# Patient Record
Sex: Male | Born: 1965 | Race: White | Hispanic: No | Marital: Married | Smoking: Never smoker
Health system: Southern US, Community
[De-identification: ages and names within clinical notes are randomized; demographics above are authoritative.]

## PROBLEM LIST (undated history)

## (undated) DIAGNOSIS — E7800 Pure hypercholesterolemia, unspecified: Secondary | ICD-10-CM

## (undated) DIAGNOSIS — Z87442 Personal history of urinary calculi: Secondary | ICD-10-CM

## (undated) DIAGNOSIS — Z8269 Family history of other diseases of the musculoskeletal system and connective tissue: Secondary | ICD-10-CM

## (undated) DIAGNOSIS — I10 Essential (primary) hypertension: Secondary | ICD-10-CM

## (undated) DIAGNOSIS — G43009 Migraine without aura, not intractable, without status migrainosus: Secondary | ICD-10-CM

## (undated) DIAGNOSIS — I340 Nonrheumatic mitral (valve) insufficiency: Secondary | ICD-10-CM

## (undated) DIAGNOSIS — N289 Disorder of kidney and ureter, unspecified: Secondary | ICD-10-CM

## (undated) HISTORY — PX: LITHOTRIPSY: SUR834

## (undated) HISTORY — PX: SHOULDER ARTHROSCOPY: SHX128

---
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
## 2016-03-26 ENCOUNTER — Emergency Department
Admission: EM | Admit: 2016-03-26 | Discharge: 2016-03-26 | Disposition: A | Discharge status: Home / Self Care | Payer: Worker's Compensation | Attending: Emergency Medicine | Admitting: Emergency Medicine

## 2016-03-26 ENCOUNTER — Encounter: Payer: Self-pay | Admitting: Emergency Medicine

## 2016-03-26 ENCOUNTER — Emergency Department: Payer: Worker's Compensation

## 2016-03-26 DIAGNOSIS — Y9389 Activity, other specified: Secondary | ICD-10-CM | POA: Insufficient documentation

## 2016-03-26 DIAGNOSIS — I10 Essential (primary) hypertension: Secondary | ICD-10-CM | POA: Diagnosis not present

## 2016-03-26 DIAGNOSIS — M7652 Patellar tendinitis, left knee: Secondary | ICD-10-CM | POA: Diagnosis not present

## 2016-03-26 DIAGNOSIS — Y929 Unspecified place or not applicable: Secondary | ICD-10-CM | POA: Insufficient documentation

## 2016-03-26 DIAGNOSIS — Y990 Civilian activity done for income or pay: Secondary | ICD-10-CM | POA: Diagnosis not present

## 2016-03-26 DIAGNOSIS — X501XXA Overexertion from prolonged static or awkward postures, initial encounter: Secondary | ICD-10-CM | POA: Insufficient documentation

## 2016-03-26 DIAGNOSIS — M25562 Pain in left knee: Secondary | ICD-10-CM | POA: Diagnosis present

## 2016-03-26 HISTORY — DX: Essential (primary) hypertension: I10

## 2016-03-26 HISTORY — DX: Nonrheumatic mitral (valve) insufficiency: I34.0

## 2016-03-26 MED ORDER — NAPROXEN 500 MG PO TABS: 500.0000 mg | ORAL_TABLET | Freq: Two times a day (BID) | ORAL | Status: DC

## 2016-03-26 MED ORDER — TRAMADOL HCL 50 MG PO TABS: 50.0000 mg | ORAL_TABLET | Freq: Four times a day (QID) | ORAL | 0 refills | Status: DC | PRN

## 2016-03-26 NOTE — ED Provider Notes (Signed)
[facility] Emergency Department Provider Note   ____________________________________________   None    (approximate)  I have reviewed the triage vital signs and the nursing notes.   HISTORY  Chief Complaint Knee Pain    HPI Jose Downs is a 50 y.o. male patient complaining of left knee pain status post prolonged kneeling incident 3 days ago. Patient state he felt a "pop" upon standing. Patient states since incident in the history of unstable in the pain radiates from dull to sharp. Patient state pain does increase with ambulation. Patient currently rates the pain as a 2/10. Patient states took meloxicam for this complaint.   Past Medical History:  Diagnosis Date  . Hypertension   . Mitral valve regurgitation     There are no active problems to display for this patient.   Past Surgical History:  Procedure Laterality Date  . HERNIA REPAIR    . SHOULDER ARTHROSCOPY Right     Prior to Admission medications   Medication Sig Start Date End Date Taking? Authorizing Provider  naproxen (NAPROSYN) 500 MG tablet Take 1 tablet (500 mg total) by mouth 2 (two) times daily with a meal. 03/26/16   Sable Feil, PA-C  traMADol (ULTRAM) 50 MG tablet Take 1 tablet (50 mg total) by mouth every 6 (six) hours as needed. 03/26/16 03/26/17  Sable Feil, PA-C    Allergies Review of patient's allergies indicates no known allergies.  No family history on file.  Social History Social History  Substance Use Topics  . Smoking status: Never Smoker  . Smokeless tobacco: Never Used  . Alcohol use Yes    Review of Systems Constitutional: No fever/chills Eyes: No visual changes. ENT: No sore throat. Cardiovascular: Denies chest pain. Respiratory: Denies shortness of breath. Gastrointestinal: No abdominal pain.  No nausea, no vomiting.  No diarrhea.  No constipation. Genitourinary: Negative for dysuria. Musculoskeletal: Positive for left knee pain Skin:  Negative for rash. Neurological: Negative for headaches, focal weakness or numbness. Endocrine:Hypertension ____________________________________________   PHYSICAL EXAM:  VITAL SIGNS: ED Triage Vitals  Enc Vitals Group     BP 03/26/16 1130 (!) 165/110     Pulse Rate 03/26/16 1130 74     Resp 03/26/16 1130 20     Temp 03/26/16 1130 98.4 F (36.9 C)     Temp Source 03/26/16 1130 Oral     SpO2 03/26/16 1130 98 %     Weight 03/26/16 1128 162 lb (73.5 kg)     Height 03/26/16 1128 5\' 6"  (1.676 m)     Head Circumference --      Peak Flow --      Pain Score 03/26/16 1128 3     Pain Loc --      Pain Edu? --      Excl. in [place]? --     Constitutional: Alert and oriented. Well appearing and in no acute distress. Eyes: Conjunctivae are normal. PERRL. EOMI. Head: Atraumatic. Nose: No congestion/rhinnorhea. Mouth/Throat: Mucous membranes are moist.  Oropharynx non-erythematous. Neck: No stridor.  No cervical spine tenderness to palpation. Hematological/Lymphatic/Immunilogical: No cervical lymphadenopathy. Cardiovascular: Normal rate, regular rhythm. Grossly normal heart sounds.  Good peripheral circulation. Respiratory: Normal respiratory effort.  No retractions. Lungs CTAB. Gastrointestinal: Soft and nontender. No distention. No abdominal bruits. No CVA tenderness. Musculoskeletal: No lower extremity tenderness nor edema.  No joint effusions. Neurologic:  Normal speech and language. No gross focal neurologic deficits are appreciated. No gait instability. Skin:  Skin is warm,  dry and intact. No rash noted. Psychiatric: Mood and affect are normal. Speech and behavior are normal.  ____________________________________________   LABS (all labs ordered are listed, but only abnormal results are displayed)  Labs Reviewed - No data to display ____________________________________________  EKG   ____________________________________________  RADIOLOGY No acute findings on x-ray of the  left knee.  ____________________________________________   PROCEDURES  Procedure(s) performed: None  Procedures  Critical Care performed: No  ____________________________________________   INITIAL IMPRESSION / ASSESSMENT AND PLAN / ED COURSE  Pertinent labs & imaging results that were available during my care of the patient were reviewed by me and considered in my medical decision making (see chart for details).  Tendinitis left knee. Discussed x-ray finding with patient. Patient given discharge Instructions. Patient advised to wear an elastic knee support 3-5 days. Patient may return back to work with limitations.  Clinical Course     ____________________________________________   FINAL CLINICAL IMPRESSION(S) / ED DIAGNOSES  Final diagnoses:  Patellar tendinitis of left knee      NEW MEDICATIONS STARTED DURING THIS VISIT:  New Prescriptions   NAPROXEN (NAPROSYN) 500 MG TABLET    Take 1 tablet (500 mg total) by mouth 2 (two) times daily with a meal.   TRAMADOL (ULTRAM) 50 MG TABLET    Take 1 tablet (50 mg total) by mouth every 6 (six) hours as needed.     Note:  This document was prepared using Dragon voice recognition software and may include unintentional dictation errors.    Sable Feil, PA-C 03/26/16 1302    Harvest Dark, MD 03/26/16 1524

## 2016-03-26 NOTE — ED Notes (Addendum)
Spoke with Mickel Baas from [place] at [phone] pt employer and she said their insurance provider is [employer] for [employer] and they require urine drug screen

## 2016-03-26 NOTE — ED Notes (Signed)
Workers Comp  Pt reports that he injured left knee Sunday around 11am - pt reports he as kneeling down for several hours and when he stood up he heard something "pop inside the joint and the joint is unstable" - left knee hurts off and on with no swelling noted today

## 2016-03-26 NOTE — ED Notes (Signed)
Pt completed workman's comp per employer, [place] and urine specimen completed and hand delivered to lab, pt given his and employers copy

## 2016-03-26 NOTE — ED Triage Notes (Signed)
States he is having pain to left knee  States he did a lot of kneeling and felt a pop to same knee on sunday

## 2016-08-02 HISTORY — PX: KNEE ARTHROSCOPY: SUR90

## 2016-09-06 ENCOUNTER — Encounter: Payer: Self-pay | Admitting: Urology

## 2016-09-06 ENCOUNTER — Ambulatory Visit (INDEPENDENT_AMBULATORY_CARE_PROVIDER_SITE_OTHER): Payer: BLUE CROSS/BLUE SHIELD | Admitting: Urology

## 2016-09-06 VITALS — BP 142/99 | HR 66 | Ht 66.0 in | Wt 175.7 lb

## 2016-09-06 DIAGNOSIS — R1032 Left lower quadrant pain: Secondary | ICD-10-CM

## 2016-09-06 DIAGNOSIS — Z125 Encounter for screening for malignant neoplasm of prostate: Secondary | ICD-10-CM | POA: Diagnosis not present

## 2016-09-06 NOTE — Progress Notes (Signed)
09/06/2016 10:19 AM   Dierdre Highman [birth year] [id number]  Referring provider: Luna Glasgow, DO [facility] [address] [place], [place] [address]  Chief Complaint  Patient presents with  . New Patient (Initial Visit)    groin pain x 1 mth, pt states it started after knee surgery    HPI: 51 year old male who presented initially to walk in clinic complaining of left groin pain.  He reports that he had knee surgery approximately one month ago in a few weeks after the surgery, developed left groin pain and swelling. As such, he sought further evaluation of walk-in clinic.  On examination, he is noted to have what was described as a "Small cord running posterior from medial left scrotum, similar on the right but not as pronounced" by Dr. Deloria Lair at walk in clinic.    Mr. Tawanna Sat notes that when he initially had the pain, it was dull and aching. The pain is reproducible with palpation.  He reports that he felt a fullness in his left scrotal/perineal area which initially was firm and large but is since reduced to about half the size. For the past week, the pain has started to resolve but is no very faint.  He denies any urinary issues. No gross hematuria, dysuria, frequency or urgency. No fevers or chills. No urethral discharge. No known sexual transmitted infection exposures.  He does have some baseline urinary intermittency and gets up once at night to void. Otherwise, no urinary complaints.  He also is requesting prostate cancer screening today. He does not currently have a PCP. No family history of prostate cancer.    PMH: Past Medical History:  Diagnosis Date  . Hypertension   . Mitral valve regurgitation     Surgical History: Past Surgical History:  Procedure Laterality Date  . HERNIA REPAIR    . HERNIA REPAIR  1982  . KNEE ARTHROSCOPY Left 08/02/2016  . LITHOTRIPSY    . SHOULDER ARTHROSCOPY Right     Home Medications:  Allergies as of  09/06/2016      Reactions   Other Palpitations, Swelling   Nuts/tree fruits/artificial sweeteners      Medication List       Accurate as of 09/06/16 11:59 PM. Always use your most recent med list.          aspirin EC 81 MG tablet Take 81 mg by mouth daily.   meloxicam 15 MG tablet Commonly known as:  MOBIC Take 15 mg by mouth daily.   valsartan 80 MG tablet Commonly known as:  DIOVAN       Allergies:  Allergies  Allergen Reactions  . Other Palpitations and Swelling    Nuts/tree fruits/artificial sweeteners    Family History: No family history on file.  Social History:  reports that he has never smoked. He has never used smokeless tobacco. He reports that he drinks alcohol. His drug history is not on file.  ROS: UROLOGY Frequent Urination?: No Hard to postpone urination?: No Burning/pain with urination?: No Get up at night to urinate?: Yes Leakage of urine?: No Urine stream starts and stops?: Yes Trouble starting stream?: No Do you have to strain to urinate?: No Blood in urine?: No Urinary tract infection?: No Sexually transmitted disease?: No Injury to kidneys or bladder?: No Painful intercourse?: No Weak stream?: No Erection problems?: No Penile pain?: No  Gastrointestinal Nausea?: No Vomiting?: No Indigestion/heartburn?: Yes Diarrhea?: Yes Constipation?: No  Constitutional Fever: No Night sweats?: No Weight loss?: No  Fatigue?: No  Skin Skin rash/lesions?: No Itching?: No  Eyes Blurred vision?: No Double vision?: No  Ears/Nose/Throat Sore throat?: No Sinus problems?: No  Hematologic/Lymphatic Swollen glands?: No Easy bruising?: No  Cardiovascular Leg swelling?: No Chest pain?: No  Respiratory Cough?: Yes Shortness of breath?: No  Endocrine Excessive thirst?: No  Musculoskeletal Back pain?: Yes Joint pain?: No  Neurological Headaches?: Yes Dizziness?: Yes  Psychologic Depression?: No Anxiety?: No  Physical  Exam: BP (!) 142/99   Pulse 66   Ht 5\' 6"  (1.676 m)   Wt 175 lb 11.2 oz (79.7 kg)   BMI 28.36 kg/m   Constitutional:  Alert and oriented, No acute distress. HEENT: [place] AT, moist mucus membranes.  Trachea midline, no masses. Cardiovascular: No clubbing, cyanosis, or edema. Respiratory: Normal respiratory effort, no increased work of breathing. GI: Abdomen is soft, nontender, nondistended, no abdominal masses.  No palpable inguinal hernias. GU: Circumcised phallus with orthotopic patent meatus. Normal scrotum with bilateral descended testicles, no masses, nontender.  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 Patient demonstrates a maneuver where he deviates his scrotum towards the right side and palpates in his perineum/urethral area. On my examination, this is consistent with normal anatomic structures in this area.   Rectal: Normal sphincter tone. 30 cc prostate, nontender, no nodules. Skin: No rashes, bruises or suspicious lesions. Lymph: Subtle left inguinal lymphadenopathy, less than 1 cm, non-fixed.Marland Kitchen Neurologic: Grossly intact, no focal deficits, moving all 4 extremities. Psychiatric: Normal mood and affect.  Laboratory Data: No recent laboratory data available.  Urinalysis N/a  Pertinent Imaging: n/a  Assessment & Plan:    1. Left groin pain Small left inguinal node which is nontender, non-concerning. No other scrotal/inguinal pathology on examination today  Patient's report, pain is improving spontaneously I have recommended follow-up as needed if his pain recurs or he notes enlarging node/mass  2. Screening PSA (prostate specific antigen) PSA screening guidelines were reviewed today in detail including risk-benefits Shared decision-making and has elected to proceed with PSA screening per his request Rectal exam unremarkable Will call with PSA results - PSA   Return if symptoms worsen or fail to improve.    Return if symptoms worsen or  fail to improve.  [name], MD  [facility] [address] [place], [place] [address] [phone]

## 2016-09-07 LAB — PSA: Prostate Specific Ag, Serum: 4.4 ng/mL — ABNORMAL HIGH (ref 0.0–4.0)

## 2016-09-09 ENCOUNTER — Encounter: Payer: Self-pay | Admitting: Urology

## 2016-09-09 ENCOUNTER — Telehealth: Payer: Self-pay

## 2016-09-09 DIAGNOSIS — Z125 Encounter for screening for malignant neoplasm of prostate: Secondary | ICD-10-CM

## 2016-09-09 NOTE — Telephone Encounter (Signed)
-----   Message from [name], MD sent at 09/09/2016  9:54 AM EDT ----- Please let this patient know his PSA is elevated at 4.4. This is higher than expected in a 51 year old male. Unfortunately, we have no previous PSAs for comparison. Given his recent history of scrotal, inguinal pain, I think it is wise to repeat this in about 3 months to reassess. Please schedule him for a lab draw followed by a visit shortly thereafter in 3 months. We may need to think about a biopsy in the future.  [name], MD

## 2016-09-09 NOTE — Telephone Encounter (Signed)
Spoke with pt in reference to PSA results. Made pt aware will need labs again in 66mo and depending on results may need to discuss a bx. Pt voiced understanding. Lab appt made and orders placed.

## 2016-12-10 ENCOUNTER — Encounter: Payer: Self-pay | Admitting: Urology

## 2016-12-10 ENCOUNTER — Other Ambulatory Visit: Payer: BLUE CROSS/BLUE SHIELD

## 2016-12-10 DIAGNOSIS — Z125 Encounter for screening for malignant neoplasm of prostate: Secondary | ICD-10-CM

## 2016-12-11 ENCOUNTER — Telehealth: Payer: Self-pay

## 2016-12-11 DIAGNOSIS — R972 Elevated prostate specific antigen [PSA]: Secondary | ICD-10-CM

## 2016-12-11 LAB — PSA: PROSTATE SPECIFIC AG, SERUM: 3.2 ng/mL (ref 0.0–4.0)

## 2016-12-11 NOTE — Telephone Encounter (Signed)
-----   Message from [name], MD sent at 12/11/2016  8:41 AM EDT ----- Your PSA is down to 3.2. This is very reassuring. It still on the higher side for your age. I recommend close follow-up with repeat PSA in 6 months. Please a day or 2 prior to the appointment.  [name], MD

## 2016-12-11 NOTE — Telephone Encounter (Signed)
Patient notified of results, please schedule for a 27month follow up with Erlene Quan and lab draw PSA a couple days before orders placed thanks

## 2016-12-12 NOTE — Telephone Encounter (Signed)
Apps made and mailed to the patient  Jose Downs

## 2017-03-13 ENCOUNTER — Other Ambulatory Visit: Payer: Self-pay

## 2017-03-13 ENCOUNTER — Encounter: Payer: Self-pay | Admitting: Urology

## 2017-03-13 MED ORDER — TAMSULOSIN HCL 0.4 MG PO CAPS: 0.4000 mg | ORAL_CAPSULE | Freq: Every day | ORAL | 0 refills | Status: DC

## 2017-05-05 DIAGNOSIS — Z8269 Family history of other diseases of the musculoskeletal system and connective tissue: Secondary | ICD-10-CM | POA: Insufficient documentation

## 2017-05-05 DIAGNOSIS — I10 Essential (primary) hypertension: Secondary | ICD-10-CM | POA: Insufficient documentation

## 2017-05-05 DIAGNOSIS — N200 Calculus of kidney: Secondary | ICD-10-CM | POA: Insufficient documentation

## 2017-05-05 DIAGNOSIS — E7800 Pure hypercholesterolemia, unspecified: Secondary | ICD-10-CM | POA: Insufficient documentation

## 2017-05-05 DIAGNOSIS — G43009 Migraine without aura, not intractable, without status migrainosus: Secondary | ICD-10-CM | POA: Insufficient documentation

## 2017-06-09 ENCOUNTER — Other Ambulatory Visit: Payer: BLUE CROSS/BLUE SHIELD

## 2017-06-13 ENCOUNTER — Ambulatory Visit: Payer: BLUE CROSS/BLUE SHIELD | Admitting: Urology

## 2017-08-06 ENCOUNTER — Other Ambulatory Visit
Admission: RE | Admit: 2017-08-06 | Discharge: 2017-08-06 | Disposition: A | Discharge status: Home / Self Care | Payer: BLUE CROSS/BLUE SHIELD | Source: Ambulatory Visit | Attending: Urology | Admitting: Urology

## 2017-08-06 ENCOUNTER — Other Ambulatory Visit: Payer: BLUE CROSS/BLUE SHIELD

## 2017-08-06 ENCOUNTER — Encounter: Payer: Self-pay | Admitting: Urology

## 2017-08-06 DIAGNOSIS — R972 Elevated prostate specific antigen [PSA]: Secondary | ICD-10-CM | POA: Insufficient documentation

## 2017-08-06 LAB — PSA: PROSTATIC SPECIFIC ANTIGEN: 4.3 ng/mL — AB (ref 0.00–4.00)

## 2017-08-12 ENCOUNTER — Encounter: Payer: Self-pay | Admitting: Urology

## 2017-08-12 ENCOUNTER — Ambulatory Visit (INDEPENDENT_AMBULATORY_CARE_PROVIDER_SITE_OTHER): Payer: BLUE CROSS/BLUE SHIELD | Admitting: Urology

## 2017-08-12 VITALS — BP 172/80 | HR 58 | Ht 66.0 in | Wt 165.0 lb

## 2017-08-12 DIAGNOSIS — Z87442 Personal history of urinary calculi: Secondary | ICD-10-CM

## 2017-08-12 DIAGNOSIS — R972 Elevated prostate specific antigen [PSA]: Secondary | ICD-10-CM

## 2017-08-12 DIAGNOSIS — N402 Nodular prostate without lower urinary tract symptoms: Secondary | ICD-10-CM | POA: Diagnosis not present

## 2017-08-12 NOTE — Progress Notes (Signed)
08/12/2017 5:04 PM   Jose Downs 1966/04/18 [id number]  Referring provider: No referring provider defined for this encounter.  Chief Complaint  Patient presents with  . Elevated PSA    37month    HPI: 52 year old male with history of elevated PSA who returns today for annual follow-up.  He was last seen approximately 1 year ago O times PSA was 4.4.  This time, there are no previous PSAs for evaluation.  He had a repeat PSA which came down to 3.3 with repeat follow PSA.  His PSA is now back up to 4.30.  He denies any urinary symptoms including no urgency, frequency, incomplete bladder emptying, nocturia.  He is pleased with his voiding.  Stream is adequate.  He denies a family history of prostate cancer.  He does have a personal history of nephrolithiasis.  He believes he passed some small gravel-like particles about 6 months ago.  Flomax helps with this.  He has not had any recent flank pain or gross hematuria.  No recent imaging.   PMH: Past Medical History:  Diagnosis Date  . Hypertension   . Mitral valve regurgitation     Surgical History: Past Surgical History:  Procedure Laterality Date  . HERNIA REPAIR    . HERNIA REPAIR  1982  . KNEE ARTHROSCOPY Left 08/02/2016  . LITHOTRIPSY    . SHOULDER ARTHROSCOPY Right     Home Medications:  Allergies as of 08/12/2017      Reactions   Other Palpitations, Swelling   Nuts/tree fruits/artificial sweeteners      Medication List        Accurate as of 08/12/17  5:04 PM. Always use your most recent med list.          aspirin EC 81 MG tablet Take 81 mg by mouth daily.   meloxicam 15 MG tablet Commonly known as:  MOBIC Take 15 mg by mouth daily.   tamsulosin 0.4 MG Caps capsule Commonly known as:  FLOMAX Take 1 capsule (0.4 mg total) by mouth daily.   valsartan 80 MG tablet Commonly known as:  DIOVAN       Allergies:  Allergies  Allergen Reactions  . Other Palpitations and Swelling    Nuts/tree  fruits/artificial sweeteners    Family History: No family history on file.  Social History:  reports that  has never smoked. he has never used smokeless tobacco. He reports that he drinks alcohol. His drug history is not on file.  ROS: UROLOGY Frequent Urination?: No Hard to postpone urination?: No Burning/pain with urination?: No Get up at night to urinate?: No Leakage of urine?: No Urine stream starts and stops?: No Trouble starting stream?: No Do you have to strain to urinate?: No Blood in urine?: No Urinary tract infection?: No Sexually transmitted disease?: No Injury to kidneys or bladder?: No Painful intercourse?: No Weak stream?: No Erection problems?: No Penile pain?: No  Gastrointestinal Nausea?: No Vomiting?: No Indigestion/heartburn?: No Diarrhea?: No Constipation?: No  Constitutional Fever: No Night sweats?: No Weight loss?: No Fatigue?: No  Skin Skin rash/lesions?: No Itching?: No  Eyes Blurred vision?: No Double vision?: No  Ears/Nose/Throat Sore throat?: No Sinus problems?: No  Hematologic/Lymphatic Swollen glands?: No Easy bruising?: No  Cardiovascular Leg swelling?: No Chest pain?: No  Respiratory Cough?: No Shortness of breath?: No  Endocrine Excessive thirst?: No  Musculoskeletal Back pain?: No Joint pain?: No  Neurological Headaches?: No Dizziness?: No  Psychologic Depression?: No Anxiety?: No  Physical Exam: BP (!) 172/80  Pulse (!) 58   Ht 5\' 6"  (1.676 m)   Wt 165 lb (74.8 kg)   BMI 26.63 kg/m   Constitutional:  Alert and oriented, No acute distress. HEENT: [place] AT, moist mucus membranes.  Trachea midline, no masses. Cardiovascular: No clubbing, cyanosis, or edema. Respiratory: Normal respiratory effort, no increased work of breathing. GI: Abdomen is soft, nontender, nondistended, no abdominal masses Rectal: Normal sphincter tone.  30 cc with a small nodule in the left mid gland Skin: No rashes, bruises  or suspicious lesions. Neurologic: Grossly intact, no focal deficits, moving all 4 extremities. Psychiatric: Normal mood and affect.  Laboratory Data: Component     Latest Ref Rng & Units 08/06/2017  Prostatic Specific Antigen     0.00 - 4.00 ng/mL 4.30 (H)    Lab Results  Component Value Date   PSA1 3.2 12/10/2016   PSA1 4.4 (H) 09/06/2016    Urinalysis N/a  Pertinent Imaging: N/a  Assessment & Plan:    1. Prostate nodule Small prostate nodule today palpable which is new from elevated from where I would expect his age to be.  This may be indicative of inflammation but certainly prostate cancer is relatively high in the differential.  Given the above, highly recommend prostate biopsy.  We discussed prostate biopsy in detail including the procedure itself, the risks of blood in the urine, stool, and ejaculate, serious infection, and discomfort. He is willing to proceed with this as discussed.  2. Elevated PSA As above  3. History of kidney stones Personal history of nephrolithiasis Past small gravel over the past year Recommend KUB, declined due to concern for cost   Return for prostate biopsy.    [name], MD  [facility] [address] [place], [place] [address] [phone]

## 2017-08-27 ENCOUNTER — Encounter: Payer: Self-pay | Admitting: Urology

## 2017-08-27 ENCOUNTER — Ambulatory Visit (INDEPENDENT_AMBULATORY_CARE_PROVIDER_SITE_OTHER): Payer: BLUE CROSS/BLUE SHIELD | Admitting: Urology

## 2017-08-27 ENCOUNTER — Other Ambulatory Visit: Payer: Self-pay | Admitting: Urology

## 2017-08-27 VITALS — BP 174/112 | HR 60 | Ht 66.0 in | Wt 170.0 lb

## 2017-08-27 DIAGNOSIS — R972 Elevated prostate specific antigen [PSA]: Secondary | ICD-10-CM

## 2017-08-27 DIAGNOSIS — N402 Nodular prostate without lower urinary tract symptoms: Secondary | ICD-10-CM

## 2017-08-27 NOTE — Progress Notes (Signed)
   08/27/17  CC:  Chief Complaint  Patient presents with  . Biopsy    HPI: 52 yo M with elevated PSA to 4.30 with prostate nodule at the left mid gland concerning for prostate cancer. He presents today for biopsy.  Blood pressure (!) 174/112, pulse 60, height 5\' 6"  (1.676 m), weight 170 lb (77.1 kg). NED. A&Ox3.   No respiratory distress   Abd soft, NT, ND Normal sphincter tone  Prostate Biopsy Procedure   Informed consent was obtained after discussing risks/benefits of the procedure.  A time out was performed to ensure correct patient identity.  Pre-Procedure: - Gentamicin given prophylactically - Levaquin 500 mg administered PO -Transrectal Ultrasound performed revealing a 36 gm prostate -No median lobe appreciated -Diffusely heterogeneous echotexture throughout prostate -~ 8 mm left cystic structure at the base of the seminal vesicle, biopsied with left base biopsy  Procedure: - Prostate block performed using 10 cc 1% lidocaine and biopsies taken from sextant areas, a total of 12 under ultrasound guidance.  Post-Procedure: - Patient tolerated the procedure well - He was counseled to seek immediate medical attention if experiences any severe pain, significant bleeding, or fevers - Return in one week to discuss biopsy results  [name], MD

## 2017-09-02 ENCOUNTER — Other Ambulatory Visit: Payer: BLUE CROSS/BLUE SHIELD | Admitting: Urology

## 2017-09-03 ENCOUNTER — Other Ambulatory Visit: Payer: Self-pay | Admitting: Urology

## 2017-09-03 LAB — PATHOLOGY REPORT

## 2017-09-09 ENCOUNTER — Ambulatory Visit: Payer: BLUE CROSS/BLUE SHIELD | Admitting: Urology

## 2017-09-10 ENCOUNTER — Ambulatory Visit (INDEPENDENT_AMBULATORY_CARE_PROVIDER_SITE_OTHER): Payer: BLUE CROSS/BLUE SHIELD | Admitting: Urology

## 2017-09-10 ENCOUNTER — Encounter: Payer: Self-pay | Admitting: Urology

## 2017-09-10 VITALS — BP 167/103 | HR 65 | Ht 66.0 in | Wt 165.0 lb

## 2017-09-10 DIAGNOSIS — C61 Malignant neoplasm of prostate: Secondary | ICD-10-CM

## 2017-09-10 NOTE — Progress Notes (Signed)
09/10/2017 4:43 PM   Jose Downs [birth year] [id number]  Referring provider: Kirk Ruths, MD [facility] [place], [place] [address]  Chief Complaint  Patient presents with  . Prostate Cancer    biopsy results    HPI: 52 year old male who returns the office today to discuss his newly diagnosed prostate cancer.  He underwent prostate biopsy on 08/27/2017 in the setting of an elevated PSA to 4.30 and an abnormal rectal exam.  He has a personal history of fluctuating PSA. TRUS vol 36 cc.  Prostate biopsy reveals a single core Gleason 3+3 involving only 6% of the tissue.  The remainder of the biopsies were benign.  There were 2 cores with chronic and acute inflammation.  An ejaculatory duct cyst was biopsied was noted to be benign.  He is done well following the biopsy.  He does have hematospermia, otherwise no other symptoms.  He has minimal baseline urinary symptoms.  He has excellent erections.  IPSS and shim as below.  IPSS    Row Name 09/10/17 1600         International Prostate Symptom Score   How often have you had the sensation of not emptying your bladder?  About half the time     How often have you had to urinate less than every two hours?  Less than 1 in 5 times     How often have you found you stopped and started again several times when you urinated?  Not at All     How often have you found it difficult to postpone urination?  Less than 1 in 5 times     How often have you had a weak urinary stream?  Less than 1 in 5 times     How often have you had to strain to start urination?  Not at All     How many times did you typically get up at night to urinate?  1 Time     Total IPSS Score  7       Quality of Life due to urinary symptoms   If you were to spend the rest of your life with your urinary condition just the way it is now how would you feel about that?  Mostly Satisfied        [place] Name 09/10/17 1622         SHIM: Over the last 6 months:   How do you rate your confidence that you could get and keep an erection?  High     When you had erections with sexual stimulation, how often were your erections hard enough for penetration (entering your partner)?  Almost Always or Always     During sexual intercourse, how often were you able to maintain your erection after you had penetrated (entered) your partner?  Most Times (much more than half the time)     During sexual intercourse, how difficult was it to maintain your erection to completion of intercourse?  Slightly Difficult     When you attempted sexual intercourse, how often was it satisfactory for you?  Sometimes (about half the time)       SHIM Total Score   SHIM  20           PMH: Past Medical History:  Diagnosis Date  . Hypertension   . Mitral valve regurgitation     Surgical History: Past Surgical History:  Procedure Laterality Date  .  HERNIA REPAIR    . HERNIA REPAIR  1982  . KNEE ARTHROSCOPY Left 08/02/2016  . LITHOTRIPSY    . SHOULDER ARTHROSCOPY Right     Home Medications:  Allergies as of 09/10/2017      Reactions   Other Palpitations, Swelling   Nuts/tree fruits/artificial sweeteners      Medication List        Accurate as of 09/10/17  4:43 PM. Always use your most recent med list.          aspirin EC 81 MG tablet Take 81 mg by mouth daily.   meloxicam 15 MG tablet Commonly known as:  MOBIC Take 15 mg by mouth daily.   tamsulosin 0.4 MG Caps capsule Commonly known as:  FLOMAX Take 1 capsule (0.4 mg total) by mouth daily.   valsartan 80 MG tablet Commonly known as:  DIOVAN       Allergies:  Allergies  Allergen Reactions  . Other Palpitations and Swelling    Nuts/tree fruits/artificial sweeteners    Family History: No family history on file.  Social History:  reports that  has never smoked. he has never used smokeless tobacco. He reports that he drinks alcohol. His drug history is not on  file.  ROS: UROLOGY Frequent Urination?: No Hard to postpone urination?: No Burning/pain with urination?: No Get up at night to urinate?: No Leakage of urine?: No Urine stream starts and stops?: No Trouble starting stream?: Yes Do you have to strain to urinate?: No Blood in urine?: No Urinary tract infection?: No Sexually transmitted disease?: No Injury to kidneys or bladder?: No Painful intercourse?: No Weak stream?: No Erection problems?: No Penile pain?: No  Gastrointestinal Nausea?: No Vomiting?: No Indigestion/heartburn?: Yes Diarrhea?: No Constipation?: No  Constitutional Fever: No Night sweats?: No Weight loss?: No Fatigue?: No  Skin Skin rash/lesions?: No Itching?: No  Eyes Blurred vision?: No Double vision?: No  Ears/Nose/Throat Sore throat?: No Sinus problems?: No  Hematologic/Lymphatic Swollen glands?: No Easy bruising?: No  Cardiovascular Leg swelling?: No Chest pain?: No  Respiratory Cough?: No Shortness of breath?: No  Endocrine Excessive thirst?: No  Musculoskeletal Back pain?: No Joint pain?: No  Neurological Headaches?: No Dizziness?: No  Psychologic Depression?: No Anxiety?: No  Physical Exam: BP (!) 167/103   Pulse 65   Ht 5\' 6"  (1.676 m)   Wt 165 lb (74.8 kg)   BMI 26.63 kg/m   Constitutional:  Alert and oriented, No acute distress.  Accompanied by wife today. HEENT: [place] AT, moist mucus membranes.  Trachea midline, no masses. Neurologic: Grossly intact, no focal deficits, moving all 4 extremities. Psychiatric: Normal mood and affect.  Laboratory Data: Component     Latest Ref Rng & Units 08/06/2017  Prostatic Specific Antigen     0.00 - 4.00 ng/mL 4.30 (H)    Urinalysis nA  Pertinent Imaging: N/a  Assessment & Plan:    1. Prostate cancer [facility]) Very low risk prostate cancer, T2a dx 08/2017  The patient was counseled about the natural history of prostate cancer and the standard treatment options that  are available for prostate cancer. It was explained to him how his age and life expectancy, clinical stage, Gleason score, and PSA affect his prognosis, the decision to proceed with additional staging studies, as well as how that information influences recommended treatment strategies. We discussed the roles for active surveillance, radiation therapy, surgical therapy, androgen deprivation, as well as ablative therapy options for the treatment of prostate cancer as appropriate to  his individual cancer situation. We discussed the risks and benefits of these options with regard to their impact on cancer control and also in terms of potential adverse events, complications, and impact on quality of life particularly related to urinary, bowel, and sexual function. The patient was encouraged to ask questions throughout the discussion today and all questions were answered to his stated satisfaction. In addition, the patient was providedwith and/or directed to appropriate resources and literature for further education about prostate cancer treatment options.  Given his very low risk prostate cancer, probably recommended active surveillance.  We will plan on following PSAs every 4 months in the beginning.  At one year, we will consider endorectal MRI versus repeat biopsy to rule out sampling error.  He is agreeable this plan.  All questions answered today.   Return in about 4 months (around 01/10/2018) for PSA.  [name], MD  [facility] [address] [place], [place] [address] [phone]  I spent 25 min with this patient of which greater than 50% was spent in counseling and coordination of care with the patient.

## 2017-10-13 ENCOUNTER — Other Ambulatory Visit: Payer: Self-pay | Admitting: Physician Assistant

## 2017-10-13 ENCOUNTER — Ambulatory Visit
Admission: RE | Admit: 2017-10-13 | Discharge: 2017-10-13 | Disposition: A | Discharge status: Home / Self Care | Payer: BLUE CROSS/BLUE SHIELD | Source: Ambulatory Visit | Attending: Physician Assistant | Admitting: Physician Assistant

## 2017-10-13 DIAGNOSIS — J3489 Other specified disorders of nose and nasal sinuses: Secondary | ICD-10-CM | POA: Insufficient documentation

## 2017-11-20 ENCOUNTER — Other Ambulatory Visit: Payer: Self-pay | Admitting: Otolaryngology

## 2017-11-20 DIAGNOSIS — R519 Headache, unspecified: Secondary | ICD-10-CM

## 2017-11-20 DIAGNOSIS — R51 Headache: Principal | ICD-10-CM

## 2017-12-05 ENCOUNTER — Ambulatory Visit: Payer: BLUE CROSS/BLUE SHIELD

## 2017-12-11 ENCOUNTER — Other Ambulatory Visit: Payer: Self-pay | Admitting: Neurology

## 2017-12-11 DIAGNOSIS — G500 Trigeminal neuralgia: Secondary | ICD-10-CM

## 2017-12-15 ENCOUNTER — Other Ambulatory Visit: Payer: Self-pay | Admitting: Neurology

## 2017-12-15 ENCOUNTER — Ambulatory Visit
Admission: RE | Admit: 2017-12-15 | Discharge: 2017-12-15 | Disposition: A | Discharge status: Home / Self Care | Payer: BLUE CROSS/BLUE SHIELD | Source: Ambulatory Visit | Attending: Neurology | Admitting: Neurology

## 2017-12-15 DIAGNOSIS — G500 Trigeminal neuralgia: Secondary | ICD-10-CM

## 2017-12-15 MED ORDER — GADOBENATE DIMEGLUMINE 529 MG/ML IV SOLN
16.0000 mL | Freq: Once | INTRAVENOUS | Status: AC | PRN
  Administered 2017-12-15: 16 mL via INTRAVENOUS

## 2017-12-23 ENCOUNTER — Ambulatory Visit: Payer: BLUE CROSS/BLUE SHIELD

## 2018-02-02 ENCOUNTER — Other Ambulatory Visit: Payer: Self-pay | Admitting: Family Medicine

## 2018-02-02 DIAGNOSIS — C61 Malignant neoplasm of prostate: Secondary | ICD-10-CM

## 2018-02-03 ENCOUNTER — Other Ambulatory Visit: Payer: BLUE CROSS/BLUE SHIELD

## 2018-02-03 DIAGNOSIS — C61 Malignant neoplasm of prostate: Secondary | ICD-10-CM

## 2018-02-04 LAB — PSA: Prostate Specific Ag, Serum: 4.2 ng/mL — ABNORMAL HIGH (ref 0.0–4.0)

## 2018-02-10 ENCOUNTER — Ambulatory Visit (INDEPENDENT_AMBULATORY_CARE_PROVIDER_SITE_OTHER): Payer: BLUE CROSS/BLUE SHIELD | Admitting: Urology

## 2018-02-10 ENCOUNTER — Encounter: Payer: Self-pay | Admitting: Urology

## 2018-02-10 VITALS — BP 155/102 | HR 72

## 2018-02-10 DIAGNOSIS — C61 Malignant neoplasm of prostate: Secondary | ICD-10-CM | POA: Diagnosis not present

## 2018-02-10 NOTE — Progress Notes (Signed)
02/10/2018 4:16 PM   Jose Downs Sep 19, 1965 [id number]  Referring provider: Kirk Ruths, MD [facility] [place], [place] [address]  Chief Complaint  Patient presents with  . Prostate Cancer    53month    HPI: 52 year old male with low risk prostate cancer on active surveillance who returns today for his 18-month follow-up.  Most recent PSA was 4.2 on 02/03/2018.  He initially underwent prostate biopsy on 08/27/2017 in the setting of an elevated PSA to 4.30 and an abnormal rectal exam.  He has a personal history of fluctuating PSA. TRUS vol 36 cc.  Prostate biopsy revealed a single core Gleason 3+3 involving only 6% of the tissue.  The remainder of the biopsies were benign.  There were 2 cores with chronic and acute inflammation.  An ejaculatory duct cyst was biopsied was noted to be benign.  He denies any significant urinary symptoms today.  No weight loss or bone pain.  PMH: Past Medical History:  Diagnosis Date  . Hypertension   . Mitral valve regurgitation     Surgical History: Past Surgical History:  Procedure Laterality Date  . HERNIA REPAIR    . HERNIA REPAIR  1982  . KNEE ARTHROSCOPY Left 08/02/2016  . LITHOTRIPSY    . SHOULDER ARTHROSCOPY Right     Home Medications:  Allergies as of 02/10/2018      Reactions   Other Palpitations, Swelling   Nuts/tree fruits/artificial sweeteners      Medication List        Accurate as of 02/10/18 11:59 PM. Always use your most recent med list.          aspirin EC 81 MG tablet Take 81 mg by mouth daily.   meloxicam 15 MG tablet Commonly known as:  MOBIC Take 15 mg by mouth daily.   tamsulosin 0.4 MG Caps capsule Commonly known as:  FLOMAX Take 1 capsule (0.4 mg total) by mouth daily.   valsartan 80 MG tablet Commonly known as:  DIOVAN       Allergies:  Allergies  Allergen Reactions  . Other Palpitations and Swelling    Nuts/tree fruits/artificial sweeteners     Family History: No family history on file.  Social History:  reports that he has never smoked. He has never used smokeless tobacco. He reports that he drinks alcohol. His drug history is not on file.  ROS: UROLOGY Frequent Urination?: No Hard to postpone urination?: No Burning/pain with urination?: No Get up at night to urinate?: Yes Leakage of urine?: No Urine stream starts and stops?: No Trouble starting stream?: No Do you have to strain to urinate?: No Blood in urine?: No Urinary tract infection?: No Sexually transmitted disease?: No Injury to kidneys or bladder?: No Painful intercourse?: No Weak stream?: No Erection problems?: Yes Penile pain?: No  Gastrointestinal Nausea?: No Vomiting?: No Indigestion/heartburn?: Yes Diarrhea?: No Constipation?: No  Constitutional Fever: No Night sweats?: No Weight loss?: No Fatigue?: No  Skin Skin rash/lesions?: No Itching?: No  Eyes Blurred vision?: No Double vision?: No  Ears/Nose/Throat Sore throat?: No Sinus problems?: No  Hematologic/Lymphatic Swollen glands?: No Easy bruising?: No  Cardiovascular Leg swelling?: No Chest pain?: No  Respiratory Cough?: No Shortness of breath?: No  Endocrine Excessive thirst?: No  Musculoskeletal Back pain?: No Joint pain?: No  Neurological Headaches?: No Dizziness?: No  Psychologic Depression?: No Anxiety?: No  Physical Exam: BP (!) 155/102   Pulse 72   Constitutional:  Alert and oriented,  No acute distress. HEENT: [place] AT, moist mucus membranes.  Trachea midline, no masses. Cardiovascular: No clubbing, cyanosis, or edema. Respiratory: Normal respiratory effort, no increased work of breathing. Skin: No rashes, bruises or suspicious lesions. Neurologic: Grossly intact, no focal deficits, moving all 4 extremities. Psychiatric: Normal mood and affect.  Laboratory Data: Component     Latest Ref Rng & Units 09/06/2016 12/10/2016 02/03/2018  Prostate  Specific Ag, Serum     0.0 - 4.0 ng/mL 4.4 (H) 3.2 4.2 (H)    Urinalysis N/a  Pertinent Imaging: N/a  Assessment & Plan:    1. Prostate cancer [facility]) Very low risk prostate cancer diagnosed 08/2017 Continue to strongly recommend active surveillance PSA remains relatively stable Plan to repeat PSA/DRE in 6 months Consider repeat prostate biopsy versus MRI at the one year mark after biopsy   Return in about 6 months (around 08/13/2018) for PSA/ DRE.  [name], MD  [facility] [address] [place], [place] [address] [phone]

## 2018-07-14 IMAGING — CT CT MAXILLOFACIAL W/O CM
3 of 4 series · 15 of 47 positions shown, 18 images · non-contrast
Comparison: None.

CLINICAL DATA: Right maxillary sinus pressure and teeth pain.

EXAM:
CT MAXILLOFACIAL WITHOUT CONTRAST
TECHNIQUE: Multidetector CT imaging of the maxillofacial structures was
performed. Multiplanar CT image reconstructions were also generated.

[Series 2: sinus · axial · 0.26mm/px · z∈[-579,-473]mm · 9 of 63 slices shown, 12 images (1 of 3)]
[im 5/63  brain]
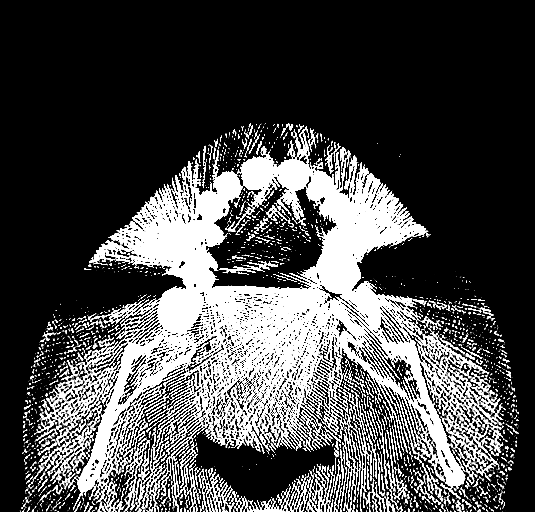
[im 5/63  bone]
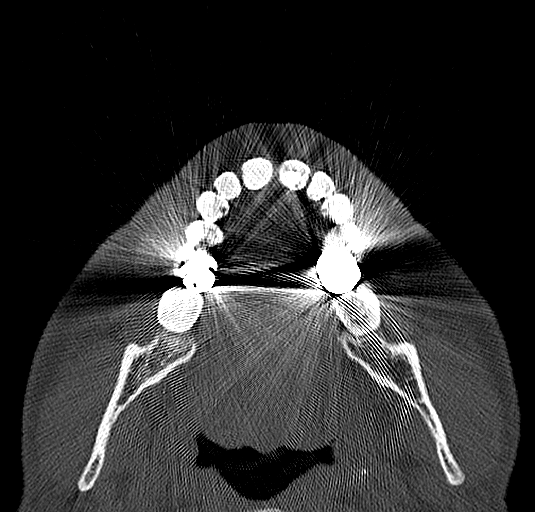
[im 14/63  bone]
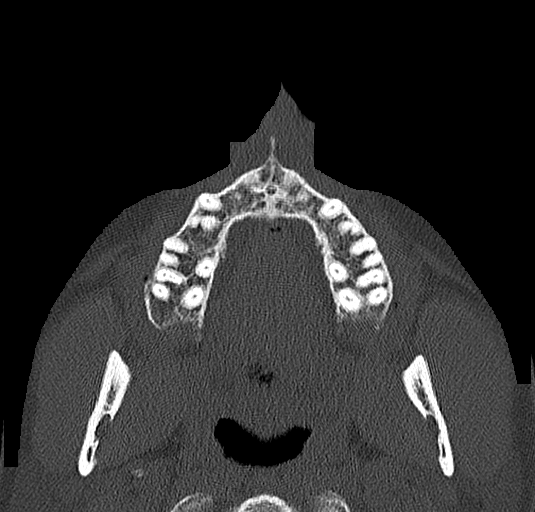
[im 18/63  bone]
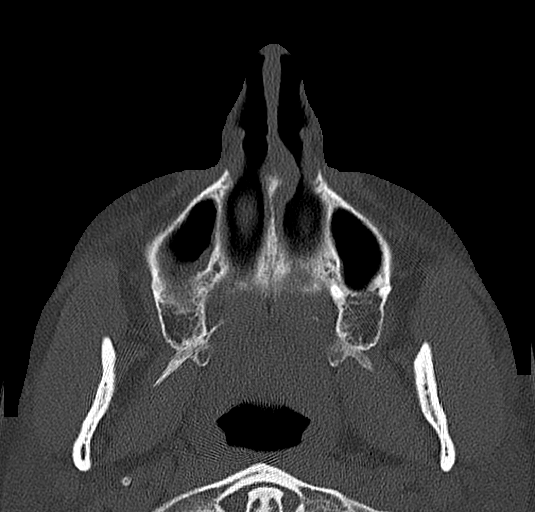
[im 27/63  bone]
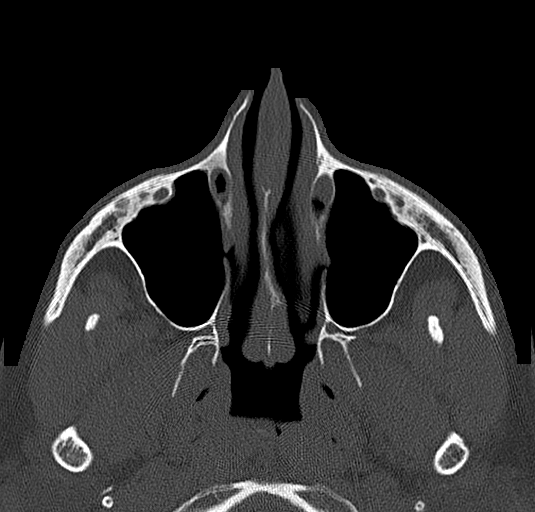
[im 32/63  brain]
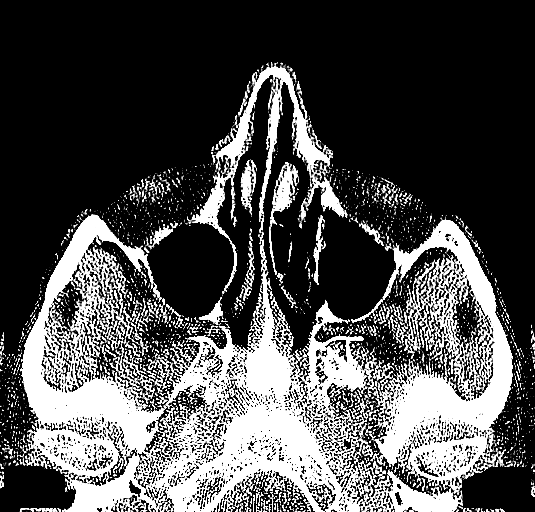
[im 32/63  bone]
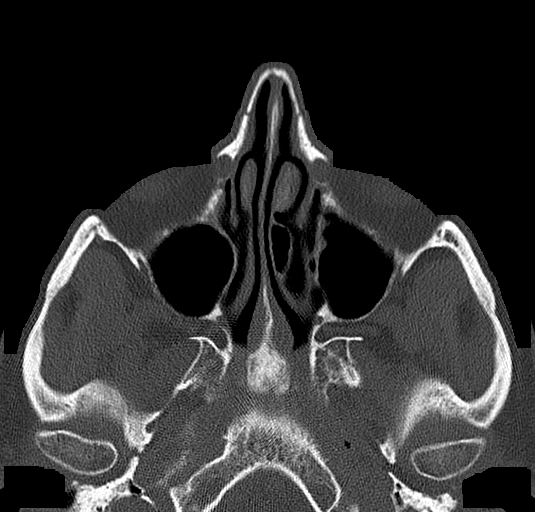
[im 36/63  bone]
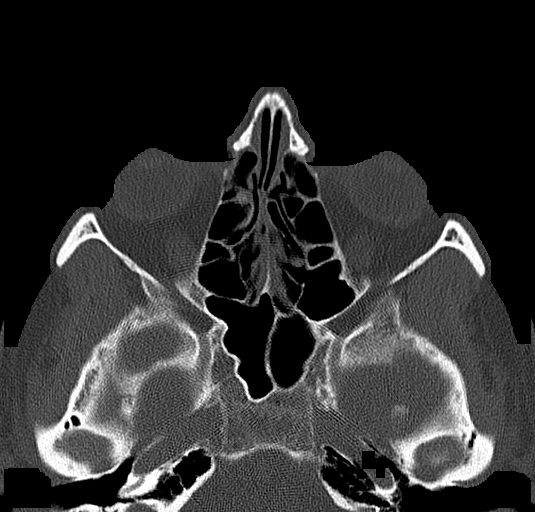
[im 45/63  bone]
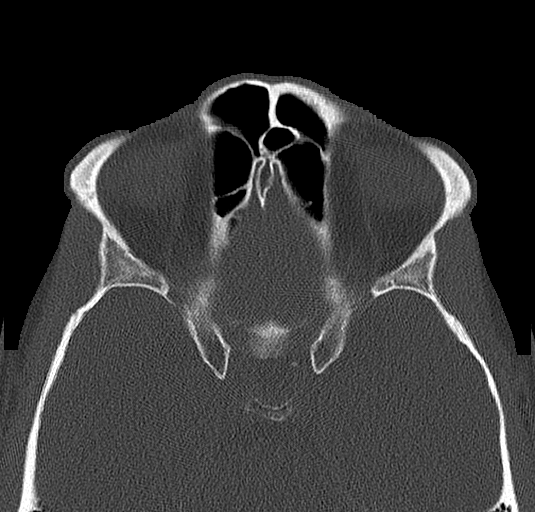
[im 49/63  bone]
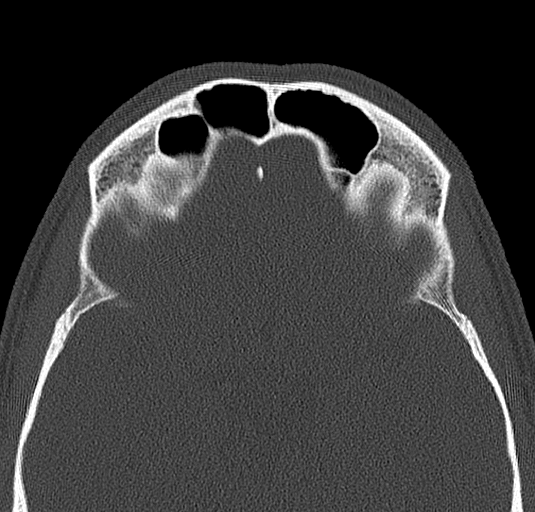
[im 58/63  brain]
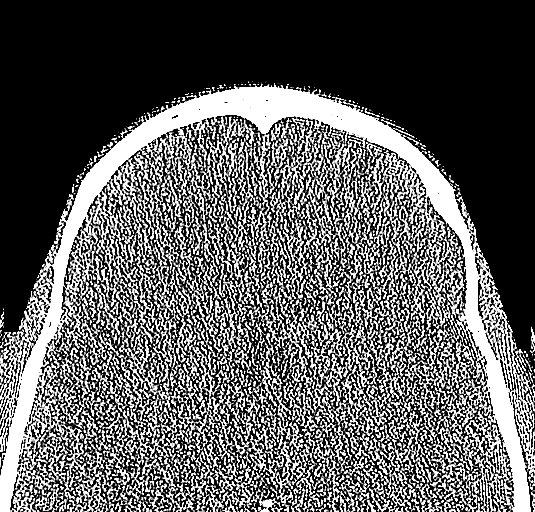
[im 58/63  bone]
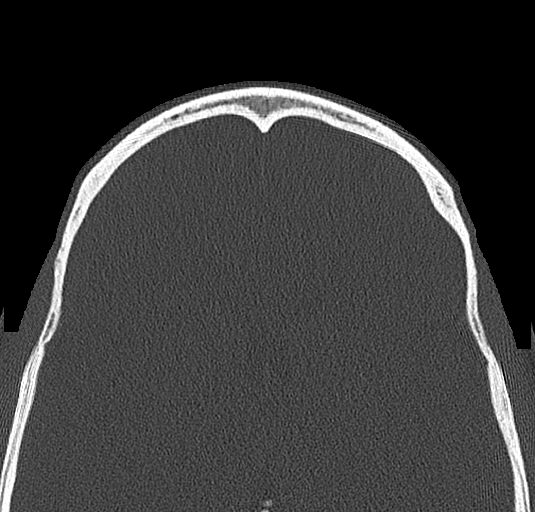

[Series 6: sinus · coronal · 0.25mm/px · 3 of 66 slices shown (2 of 3)]
[im 22/66  bone]
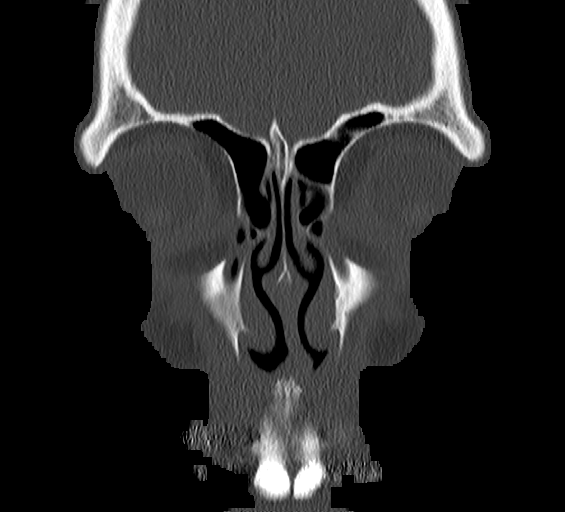
[im 29/66  bone]
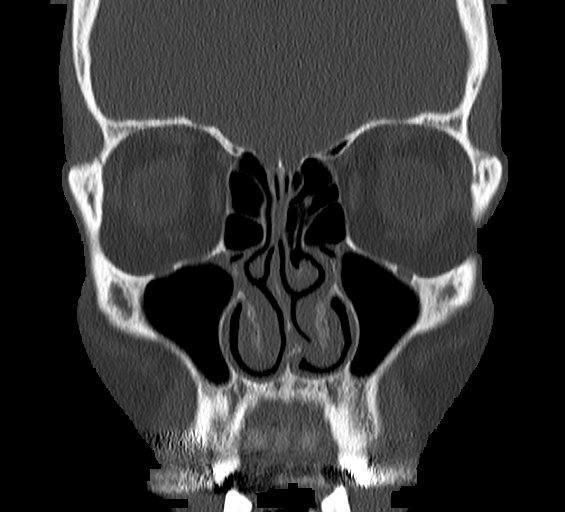
[im 37/66  bone]
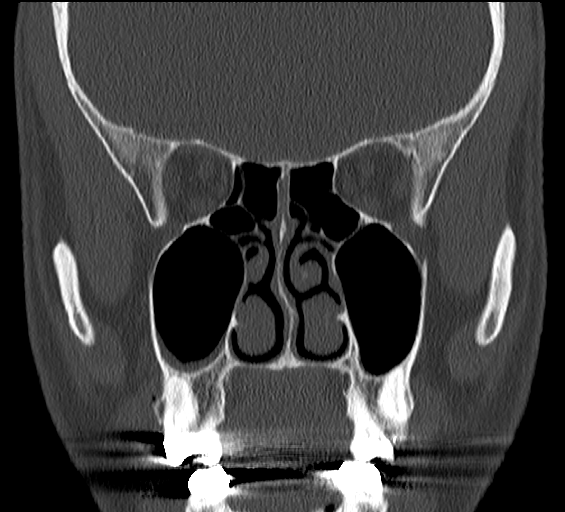

[Series 8: sinus · sagittal · 0.25mm/px · 3 of 69 slices shown (3 of 3)]
[im 23/69  bone]
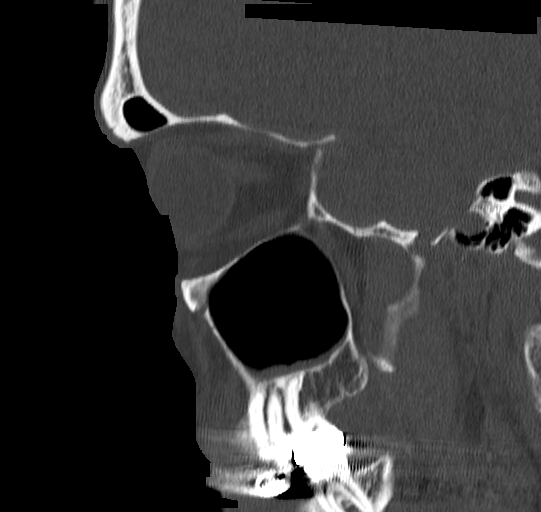
[im 35/69  bone]
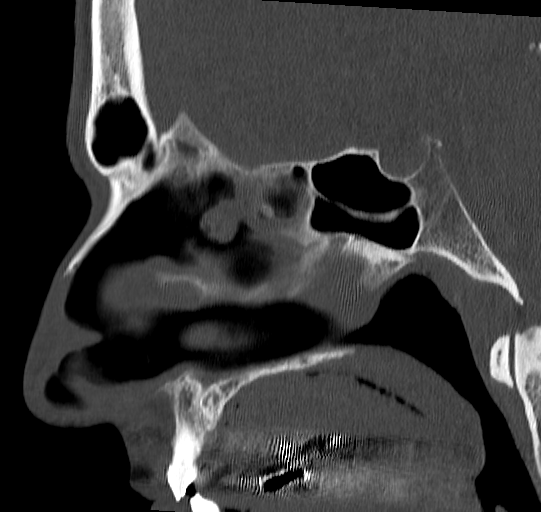
[im 46/69  bone]
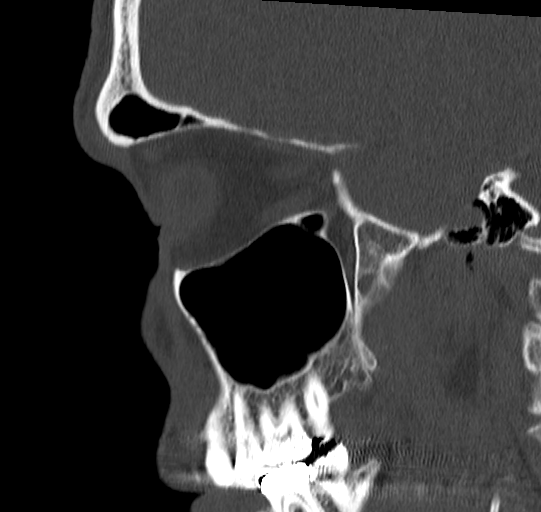

[15 of 47 positions shown; findings below may reference images not displayed]

FINDINGS: Osseous: No fracture or mandibular dislocation. No destructive
process. No periapical lucencies.

Orbits: Negative. No traumatic or inflammatory finding.

Sinuses: Mild mucosal thickening in the inferior right maxillary
sinus. Paranasal sinuses are otherwise clear. No air-fluid levels.

Soft tissues: Negative.

Limited intracranial: No significant or unexpected finding.
IMPRESSION: Minimal mucosal thickening in the right maxillary sinus. No acute
findings.

## 2018-08-07 ENCOUNTER — Other Ambulatory Visit
Admission: RE | Admit: 2018-08-07 | Discharge: 2018-08-07 | Disposition: A | Discharge status: Home / Self Care | Payer: BLUE CROSS/BLUE SHIELD | Source: Ambulatory Visit | Attending: Internal Medicine | Admitting: Internal Medicine

## 2018-08-07 DIAGNOSIS — R05 Cough: Secondary | ICD-10-CM | POA: Insufficient documentation

## 2018-08-07 LAB — FIBRIN DERIVATIVES D-DIMER (ARMC ONLY): FIBRIN DERIVATIVES D-DIMER (ARMC): 347.09 ng{FEU}/mL (ref 0.00–499.00)

## 2018-08-10 ENCOUNTER — Ambulatory Visit
Admission: RE | Admit: 2018-08-10 | Discharge: 2018-08-10 | Disposition: A | Discharge status: Home / Self Care | Payer: BLUE CROSS/BLUE SHIELD | Source: Ambulatory Visit | Attending: Internal Medicine | Admitting: Internal Medicine

## 2018-08-10 ENCOUNTER — Other Ambulatory Visit: Payer: Self-pay | Admitting: Internal Medicine

## 2018-08-10 DIAGNOSIS — R071 Chest pain on breathing: Secondary | ICD-10-CM | POA: Diagnosis present

## 2018-08-10 MED ORDER — IOHEXOL 350 MG/ML SOLN
75.0000 mL | Freq: Once | INTRAVENOUS | Status: AC | PRN
  Administered 2018-08-10: 75 mL via INTRAVENOUS

## 2018-08-13 ENCOUNTER — Other Ambulatory Visit: Payer: BLUE CROSS/BLUE SHIELD

## 2018-08-14 ENCOUNTER — Other Ambulatory Visit: Payer: Self-pay

## 2018-08-14 ENCOUNTER — Other Ambulatory Visit: Payer: BLUE CROSS/BLUE SHIELD

## 2018-08-14 DIAGNOSIS — C61 Malignant neoplasm of prostate: Secondary | ICD-10-CM

## 2018-08-17 ENCOUNTER — Ambulatory Visit: Payer: BLUE CROSS/BLUE SHIELD

## 2018-08-17 ENCOUNTER — Other Ambulatory Visit: Payer: BLUE CROSS/BLUE SHIELD

## 2018-08-17 DIAGNOSIS — C61 Malignant neoplasm of prostate: Secondary | ICD-10-CM

## 2018-08-18 ENCOUNTER — Encounter: Payer: Self-pay | Admitting: Urology

## 2018-08-18 ENCOUNTER — Ambulatory Visit (INDEPENDENT_AMBULATORY_CARE_PROVIDER_SITE_OTHER): Payer: BLUE CROSS/BLUE SHIELD | Admitting: Urology

## 2018-08-18 VITALS — BP 142/94 | HR 71 | Ht 66.0 in | Wt 173.0 lb

## 2018-08-18 DIAGNOSIS — C61 Malignant neoplasm of prostate: Secondary | ICD-10-CM

## 2018-08-18 DIAGNOSIS — N200 Calculus of kidney: Secondary | ICD-10-CM

## 2018-08-18 LAB — PSA: PROSTATE SPECIFIC AG, SERUM: 3.9 ng/mL (ref 0.0–4.0)

## 2018-08-18 NOTE — Progress Notes (Signed)
08/18/2018 5:03 PM   Jose Downs Sep 20, 1965 [id number]  Referring provider: Kirk Ruths, MD [facility] [place], [place] [address]  Chief Complaint  Patient presents with  . Prostate Cancer    7month    HPI: 53 year old male with a personal history of prostate cancer and active surveillance who returns today for follow-up.  He was last seen on 01/2018.  He initially underwent prostate biopsy on 08/27/2017 in the setting of an elevated PSA to 4.30 and an abnormal rectal exam. He has a personal history of fluctuating PSA. TRUS vol 36 cc.  Prostate biopsy revealed a single core Gleason 3+3 involving only 6% of the tissue. The remainder of the biopsies were benign. There were 2 cores with chronic and acute inflammation. An ejaculatory duct cyst was biopsied was noted to be benign.  PSA today is 3.9.  This is down from 01/2018 at which time his PSA was 4.2.  No flank pain.  No recent stone episodes.     PMH: Past Medical History:  Diagnosis Date  . Hypertension   . Mitral valve regurgitation     Surgical History: Past Surgical History:  Procedure Laterality Date  . HERNIA REPAIR    . HERNIA REPAIR  1982  . KNEE ARTHROSCOPY Left 08/02/2016  . LITHOTRIPSY    . SHOULDER ARTHROSCOPY Right     Home Medications:  Allergies as of 08/18/2018      Reactions   Other Palpitations, Swelling   Nuts/tree fruits/artificial sweeteners      Medication List       Accurate as of August 18, 2018  5:03 PM. Always use your most recent med list.        meloxicam 15 MG tablet Commonly known as:  MOBIC Take 15 mg by mouth daily.   tamsulosin 0.4 MG Caps capsule Commonly known as:  FLOMAX Take 1 capsule (0.4 mg total) by mouth daily.   valsartan 80 MG tablet Commonly known as:  DIOVAN       Allergies:  Allergies  Allergen Reactions  . Other Palpitations and Swelling    Nuts/tree fruits/artificial sweeteners    Family  History: No family history on file.  Social History:  reports that he has never smoked. He has never used smokeless tobacco. He reports current alcohol use. No history on file for drug.  ROS: UROLOGY Frequent Urination?: No Hard to postpone urination?: Yes Burning/pain with urination?: No Get up at night to urinate?: No Leakage of urine?: No Urine stream starts and stops?: No Trouble starting stream?: No Do you have to strain to urinate?: No Blood in urine?: No Urinary tract infection?: No Sexually transmitted disease?: No Injury to kidneys or bladder?: No Painful intercourse?: No Weak stream?: No Erection problems?: Yes Penile pain?: No  Gastrointestinal Nausea?: No Vomiting?: No Indigestion/heartburn?: No Diarrhea?: No Constipation?: No  Constitutional Fever: No Night sweats?: No Weight loss?: No Fatigue?: No  Skin Skin rash/lesions?: No Itching?: No  Eyes Blurred vision?: No Double vision?: No  Ears/Nose/Throat Sore throat?: No Sinus problems?: No  Hematologic/Lymphatic Swollen glands?: No Easy bruising?: No  Cardiovascular Leg swelling?: No Chest pain?: No  Respiratory Cough?: No Shortness of breath?: No  Endocrine Excessive thirst?: No  Musculoskeletal Back pain?: No Joint pain?: No  Neurological Headaches?: No Dizziness?: No  Psychologic Depression?: No Anxiety?: No  Physical Exam: BP (!) 142/94   Pulse 71   Ht 5\' 6"  (1.676 m)   Wt 173 lb (  78.5 kg)   BMI 27.92 kg/m   Constitutional:  Alert and oriented, No acute distress. HEENT: [place] AT, moist mucus membranes.  Trachea midline, no masses. Cardiovascular: No clubbing, cyanosis, or edema. Respiratory: Normal respiratory effort, no increased work of breathing. GI: Abdomen is soft, nontender, nondistended, no abdominal masses GU: No CVA tenderness Rectal: Normal sphincter tone.  30 cc prostate, stable nodule at the left mid gland.  Unchanged. Skin: No rashes, bruises or  suspicious lesions. Neurologic: Grossly intact, no focal deficits, moving all 4 extremities. Psychiatric: Normal mood and affect.  Laboratory Data: PSA as above  Pertinent Imaging: Recent CT angios chest abdomen pelvis on 08/10/2026 show bilateral stones, largest measuring 5 mm.  Assessment & Plan:    1. Prostate cancer [facility]) Personal history of prostate cancer on active surveillance PSA is down from previous, 3.9 which is reassuring Rectal exam stable We discussed options at his one-year anniversary of diagnosis including the option of repeat biopsy versus MRI versus continued surveillance, risk and benefits of each were discussed Financial reasons at this point time, he does not want to pursue any further intervention and is happy continuing to trend his PSA which is reasonable based on his initial biopsy and very low volume disease Plan for PSA in 6 months - PSA; Future  2. Kidney stones Incidental kidney stones, currently asymptomatic Recommend surveillance at this time   Return in about 6 months (around 02/16/2019) for PSA.  [name], MD  [facility] [address] [place], [place] [address] [phone]

## 2019-02-12 ENCOUNTER — Other Ambulatory Visit: Payer: BLUE CROSS/BLUE SHIELD

## 2019-02-15 ENCOUNTER — Other Ambulatory Visit: Payer: Self-pay

## 2019-02-15 ENCOUNTER — Other Ambulatory Visit
Admission: RE | Admit: 2019-02-15 | Discharge: 2019-02-15 | Disposition: A | Discharge status: Home / Self Care | Payer: BC Managed Care – PPO | Attending: Urology | Admitting: Urology

## 2019-02-15 DIAGNOSIS — C61 Malignant neoplasm of prostate: Secondary | ICD-10-CM | POA: Diagnosis present

## 2019-02-15 LAB — PSA: Prostatic Specific Antigen: 3.72 ng/mL (ref 0.00–4.00)

## 2019-02-17 ENCOUNTER — Ambulatory Visit: Payer: BLUE CROSS/BLUE SHIELD | Admitting: Urology

## 2019-02-19 ENCOUNTER — Ambulatory Visit (INDEPENDENT_AMBULATORY_CARE_PROVIDER_SITE_OTHER): Payer: Self-pay | Admitting: Urology

## 2019-02-19 ENCOUNTER — Encounter: Payer: Self-pay | Admitting: Urology

## 2019-02-19 ENCOUNTER — Other Ambulatory Visit: Payer: Self-pay

## 2019-02-19 VITALS — BP 121/83 | HR 67 | Ht 66.0 in | Wt 165.0 lb

## 2019-02-19 DIAGNOSIS — C61 Malignant neoplasm of prostate: Secondary | ICD-10-CM

## 2019-02-19 DIAGNOSIS — N5203 Combined arterial insufficiency and corporo-venous occlusive erectile dysfunction: Secondary | ICD-10-CM

## 2019-02-19 DIAGNOSIS — Z87442 Personal history of urinary calculi: Secondary | ICD-10-CM

## 2019-02-19 MED ORDER — SILDENAFIL CITRATE 20 MG PO TABS: 20.0000 mg | ORAL_TABLET | ORAL | 11 refills | Status: DC | PRN

## 2019-02-19 NOTE — Patient Instructions (Signed)
Good RX   [email or web address]  Or download app

## 2019-02-19 NOTE — Progress Notes (Signed)
02/19/2019 3:51 PM   Dierdre Highman 1966/05/10 [id number]  Referring provider: Kirk Ruths, MD [facility] [place],  [place] [address]  Chief Complaint  Patient presents with  . Prostate Cancer    67month w/PSA    HPI: 53 year old male with low risk prostate cancer on active surveillance who returns today for his 92-month follow-up.  His PSA is stable.  Trend below.    He initially underwent prostate biopsy on 08/27/2017 in the setting of an elevated PSA to 4.30 and an abnormal rectal exam. He has a personal history of fluctuating PSA. TRUS vol 36 cc.  Prostate biopsy revealed a single core Gleason 3+3 involving only 6% of the tissue. The remainder of the biopsies were benign. There were 2 cores with chronic and acute inflammation. An ejaculatory duct cyst was biopsied was noted to be benign.  He denies any significant urinary symptoms today.  No weight loss or bone pain.  No flank pain or gross hematuria.    He mentions today that he has been having increasing difficulty achieving erections over the past 10 years or so.  He believes that some of it is psychological.  He is followed closely by his primary care physician.  He does have hyperlipidemia which is untreated.  He is working on lifestyle modification for this.    PMH: Past Medical History:  Diagnosis Date  . Hypertension   . Mitral valve regurgitation     Surgical History: Past Surgical History:  Procedure Laterality Date  . HERNIA REPAIR    . HERNIA REPAIR  1982  . KNEE ARTHROSCOPY Left 08/02/2016  . LITHOTRIPSY    . SHOULDER ARTHROSCOPY Right     Home Medications:  Allergies as of 02/19/2019      Reactions   Other Palpitations, Swelling   Nuts/tree fruits/artificial sweeteners      Medication List       Accurate as of February 19, 2019  3:51 PM. If you have any questions, ask your nurse or doctor.        meloxicam 15 MG tablet Commonly known as: MOBIC  Take 15 mg by mouth daily.   sildenafil 20 MG tablet Commonly known as: Revatio Take 1 tablet (20 mg total) by mouth as needed. Take 1-5 tabs as needed prior to intercourse Started by: [name], MD   tamsulosin 0.4 MG Caps capsule Commonly known as: FLOMAX Take 1 capsule (0.4 mg total) by mouth daily.   valsartan 80 MG tablet Commonly known as: DIOVAN       Allergies:  Allergies  Allergen Reactions  . Other Palpitations and Swelling    Nuts/tree fruits/artificial sweeteners    Family History: History reviewed. No pertinent family history.  Social History:  reports that he has never smoked. He has never used smokeless tobacco. He reports current alcohol use. No history on file for drug.  ROS: UROLOGY Frequent Urination?: No Hard to postpone urination?: No Burning/pain with urination?: No Get up at night to urinate?: Yes Leakage of urine?: No Urine stream starts and stops?: No Trouble starting stream?: No Do you have to strain to urinate?: No Blood in urine?: No Urinary tract infection?: No Sexually transmitted disease?: No Injury to kidneys or bladder?: No Painful intercourse?: No Weak stream?: No Erection problems?: No Penile pain?: No  Gastrointestinal Nausea?: No Vomiting?: No Indigestion/heartburn?: No Diarrhea?: No Constipation?: No  Constitutional Fever: No Night sweats?: No Weight loss?: No Fatigue?: No  Skin Skin  rash/lesions?: No Itching?: No  Eyes Blurred vision?: No Double vision?: No  Ears/Nose/Throat Sore throat?: No Sinus problems?: No  Hematologic/Lymphatic Swollen glands?: No Easy bruising?: No  Cardiovascular Leg swelling?: No Chest pain?: No  Respiratory Cough?: No Shortness of breath?: No  Endocrine Excessive thirst?: No  Musculoskeletal Back pain?: No Joint pain?: No  Neurological Headaches?: No Dizziness?: No  Psychologic Depression?: No Anxiety?: No  Physical Exam: BP 121/83   Pulse 67    Ht 5\' 6"  (1.676 m)   Wt 165 lb (74.8 kg)   BMI 26.63 kg/m   Constitutional:  Alert and oriented, No acute distress. HEENT: [place] AT, moist mucus membranes.  Trachea midline, no masses. Cardiovascular: No clubbing, cyanosis, or edema. Respiratory: Normal respiratory effort, no increased work of breathing. Skin: No rashes, bruises or suspicious lesions. Neurologic: Grossly intact, no focal deficits, moving all 4 extremities. Psychiatric: Normal mood and affect.  Laboratory Data: Component     Latest Ref Rng & Units 08/06/2017 02/15/2019  Prostatic Specific Antigen     0.00 - 4.00 ng/mL 4.30 (H) 3.72    Assessment & Plan:    1. Prostate cancer ([place]) History of very low risk prostate cancer on active surveillance He is not interested in pursuing repeat biopsy or prostate MRI due to financial concerns Given his PSA is stable, will continue to follow with serial PSA/rectal exam annually Follow-up in 4 months with PSA prior - PSA; Standing  2. 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 Ouida Sills to see if he needs any further testing or management of his hyperlipidemia He may be interested in trying Viagra, discussed possible side effects and prescription for sildenafil sent to his pharmacy Patient vies to use good Rx out to shop with a least expensive price   Return in about 4 months (around 06/21/2019).  [name], MD  [facility] [address], [place], [place] [address] [phone]

## 2019-07-06 ENCOUNTER — Other Ambulatory Visit
Admission: RE | Admit: 2019-07-06 | Discharge: 2019-07-06 | Disposition: A | Discharge status: Home / Self Care | Payer: BC Managed Care – PPO | Attending: Urology | Admitting: Urology

## 2019-07-06 ENCOUNTER — Other Ambulatory Visit: Payer: Self-pay

## 2019-07-06 DIAGNOSIS — C61 Malignant neoplasm of prostate: Secondary | ICD-10-CM

## 2019-07-07 LAB — PSA: Prostatic Specific Antigen: 4 ng/mL (ref 0.00–4.00)

## 2019-07-09 ENCOUNTER — Encounter: Payer: Self-pay | Admitting: Urology

## 2019-07-09 ENCOUNTER — Other Ambulatory Visit: Payer: Self-pay

## 2019-07-09 ENCOUNTER — Ambulatory Visit: Payer: BC Managed Care – PPO | Admitting: Urology

## 2019-07-09 VITALS — BP 147/83 | HR 63 | Ht 66.0 in | Wt 163.0 lb

## 2019-07-09 DIAGNOSIS — N5203 Combined arterial insufficiency and corporo-venous occlusive erectile dysfunction: Secondary | ICD-10-CM | POA: Diagnosis not present

## 2019-07-09 DIAGNOSIS — C61 Malignant neoplasm of prostate: Secondary | ICD-10-CM | POA: Diagnosis not present

## 2019-07-09 MED ORDER — DIAZEPAM 10 MG PO TABS: 10.0000 mg | ORAL_TABLET | Freq: Once | ORAL | 0 refills | Status: AC

## 2019-07-09 NOTE — Progress Notes (Signed)
07/09/2019 4:15 PM   Dierdre Highman 1965/10/01 [id number]  Referring provider: Kirk Ruths, MD [facility] [place],  [place] [address]  Chief Complaint  Patient presents with  . Prostate Cancer    HPI: 54 year old male with a personal history of prostate cancer returns today for routine surveillance.  Heinitiallyunderwent prostate biopsy on 08/27/2017 in the setting of an elevated PSA to 4.30 and an abnormal rectal exam. He has a personal history of fluctuating PSA. TRUS vol 36 cc.  Prostate biopsy revealed asingle core Gleason 3+3 involving only 6% of the tissue. The remainder of the biopsies were benign. There were 2 cores with chronic and acute inflammation. An ejaculatory duct cyst was biopsied was noted to be benign.  He has previously not been interested in MRI/review prostate biopsies due to concern for cost.  PSA remains stable at 4.0 as of 07/2019  He denies any significant urinary symptoms today. No weight loss or bone pain.  He is tearful today as he mentions that he has not tried the sildenafil.  He reports that there is a psychological component as well as emotional to his erectile dysfunction.   PMH: Past Medical History:  Diagnosis Date  . Hypertension   . Mitral valve regurgitation     Surgical History: Past Surgical History:  Procedure Laterality Date  . HERNIA REPAIR    . HERNIA REPAIR  1982  . KNEE ARTHROSCOPY Left 08/02/2016  . LITHOTRIPSY    . SHOULDER ARTHROSCOPY Right     Home Medications:  Allergies as of 07/09/2019      Reactions   Other Palpitations, Swelling   Nuts/tree fruits/artificial sweeteners      Medication List       Accurate as of July 09, 2019  4:15 PM. If you have any questions, ask your nurse or doctor.        meloxicam 15 MG tablet Commonly known as: MOBIC Take 15 mg by mouth daily.   sildenafil 20 MG tablet Commonly known as: Revatio Take 1 tablet (20 mg total) by  mouth as needed. Take 1-5 tabs as needed prior to intercourse   tamsulosin 0.4 MG Caps capsule Commonly known as: FLOMAX Take 1 capsule (0.4 mg total) by mouth daily.   valsartan 80 MG tablet Commonly known as: DIOVAN       Allergies:  Allergies  Allergen Reactions  . Other Palpitations and Swelling    Nuts/tree fruits/artificial sweeteners    Family History: No family history on file.  Social History:  reports that he has never smoked. He has never used smokeless tobacco. He reports current alcohol use. No history on file for drug.  ROS: UROLOGY Frequent Urination?: No Hard to postpone urination?: No Burning/pain with urination?: No Get up at night to urinate?: Yes Leakage of urine?: No Urine stream starts and stops?: No Trouble starting stream?: No Do you have to strain to urinate?: No Blood in urine?: No Urinary tract infection?: No Sexually transmitted disease?: No Injury to kidneys or bladder?: No Painful intercourse?: No Weak stream?: No Erection problems?: Yes Penile pain?: No  Gastrointestinal Nausea?: No Vomiting?: No Indigestion/heartburn?: Yes Diarrhea?: No Constipation?: No  Constitutional Fever: No Night sweats?: No Weight loss?: No Fatigue?: No  Skin Skin rash/lesions?: No Itching?: No  Eyes Blurred vision?: No Double vision?: No  Ears/Nose/Throat Sore throat?: No Sinus problems?: No  Hematologic/Lymphatic Swollen glands?: No Easy bruising?: No  Cardiovascular Leg swelling?: No Chest pain?: No  Respiratory Cough?: No Shortness of breath?: No  Endocrine Excessive thirst?: No  Musculoskeletal Back pain?: No Joint pain?: No  Neurological Headaches?: No Dizziness?: No  Psychologic Depression?: No Anxiety?: No  Physical Exam: BP (!) 147/83   Pulse 63   Ht 5\' 6"  (1.676 m)   Wt 163 lb (73.9 kg)   BMI 26.31 kg/m   Constitutional:  Alert and oriented, No acute distress. HEENT: [place] AT, moist mucus membranes.   Trachea midline, no masses. Cardiovascular: No clubbing, cyanosis, or edema. Respiratory: Normal respiratory effort, no increased work of breathing. Rectal: Normal sphincter tone.  30 cc prostate with stable small nodule in the left mid, unchanged from previous exams. Skin: No rashes, bruises or suspicious lesions. Neurologic: Grossly intact, no focal deficits, moving all 4 extremities. Psychiatric: Normal mood and affect.  Laboratory Data: PSA as above   Assessment & Plan:    1. Prostate cancer (Newborn) Very low risk prostate cancer on active surveillance with stable PSA  He is not had any sort of confirmatory biopsy or imaging since his diagnosis we reviewed several active surveillance protocols today which suggest this would be beneficial.  He would like to avoid biopsy alone possible and may be interested in MRI.  Ultimately he was agreeable for this.  He does have a personal history of claustrophobia and anxiety thus would likely benefit from Valium.  Will be premedicated 1 hour prior to the procedure and will have a driver.  We will plan to call him with his results as appropriate based on the findings. - MR PROSTATE W WO CONTRAST; Future  2. Combined arterial insufficiency and corporo-venous occlusive erectile dysfunction Has not yet tried sildenafil, last prescription and if he is interested   Call with prostate MRI results  [name], MD  [facility] [address], [place], [place] [address] [phone]

## 2019-07-18 ENCOUNTER — Encounter: Payer: Self-pay | Admitting: Urology

## 2019-07-22 ENCOUNTER — Ambulatory Visit
Admission: RE | Admit: 2019-07-22 | Discharge: 2019-07-22 | Disposition: A | Discharge status: Home / Self Care | Payer: BC Managed Care – PPO | Source: Ambulatory Visit | Attending: Urology | Admitting: Urology

## 2019-07-22 ENCOUNTER — Other Ambulatory Visit: Payer: Self-pay

## 2019-07-22 DIAGNOSIS — C61 Malignant neoplasm of prostate: Secondary | ICD-10-CM | POA: Diagnosis not present

## 2019-07-22 LAB — POCT I-STAT CREATININE: Creatinine, Ser: 0.9 mg/dL (ref 0.61–1.24)

## 2019-07-22 MED ORDER — GADOBUTROL 1 MMOL/ML IV SOLN
7.0000 mL | Freq: Once | INTRAVENOUS | Status: AC | PRN
  Administered 2019-07-22: 7.5 mL via INTRAVENOUS

## 2019-07-23 ENCOUNTER — Telehealth: Payer: Self-pay | Admitting: Urology

## 2019-07-23 NOTE — Telephone Encounter (Signed)
Review prostate MRI results.  Radiologic findings essentially unremarkable, BPH without any significant or worrisome lesions which is very reassuring.  Continue to follow him on a every 6 month basis with PSA and rectal exam annually.  He is agreeable this plan.  Please make a 55-month appointment with PSA prior.  [name], MD

## 2020-01-20 ENCOUNTER — Other Ambulatory Visit: Payer: BC Managed Care – PPO

## 2020-01-21 ENCOUNTER — Ambulatory Visit: Payer: BC Managed Care – PPO | Admitting: Urology

## 2020-01-25 ENCOUNTER — Ambulatory Visit: Payer: BC Managed Care – PPO | Admitting: Urology

## 2020-03-03 ENCOUNTER — Ambulatory Visit: Payer: BC Managed Care – PPO | Admitting: Urology

## 2020-03-24 ENCOUNTER — Telehealth: Payer: Self-pay

## 2020-03-24 DIAGNOSIS — N200 Calculus of kidney: Secondary | ICD-10-CM

## 2020-03-24 DIAGNOSIS — Z87442 Personal history of urinary calculi: Secondary | ICD-10-CM

## 2020-03-24 NOTE — Telephone Encounter (Signed)
Pt calls and states he was recently seen at [facility] for kidney stones he has an upcoming appointment with you next Friday and wants to ensure you do not want any labs or imaging prior to his appointment. Please advise.

## 2020-03-24 NOTE — Telephone Encounter (Signed)
Thanks for heads up.  Unless he knows he passed the stone, lets have him get a KUB prior to seeing me on the day of the visit.   [name], MD

## 2020-03-24 NOTE — Telephone Encounter (Signed)
Patient notified on vmail per DPR , order for KUB placed

## 2020-03-27 ENCOUNTER — Other Ambulatory Visit
Admission: RE | Admit: 2020-03-27 | Discharge: 2020-03-27 | Disposition: A | Discharge status: Home / Self Care | Payer: Managed Care, Other (non HMO) | Attending: Urology | Admitting: Urology

## 2020-03-27 ENCOUNTER — Other Ambulatory Visit: Payer: Self-pay

## 2020-03-27 ENCOUNTER — Other Ambulatory Visit: Payer: Self-pay | Admitting: *Deleted

## 2020-03-27 DIAGNOSIS — C61 Malignant neoplasm of prostate: Secondary | ICD-10-CM | POA: Insufficient documentation

## 2020-03-27 LAB — PSA: Prostatic Specific Antigen: 5.8 ng/mL — ABNORMAL HIGH (ref 0.00–4.00)

## 2020-03-30 NOTE — Progress Notes (Signed)
03/31/2020 4:19 PM   Jose Downs 08-06-1965 [id number]  Referring provider: Kirk Ruths, MD [facility] [place],  [place] [address] Chief Complaint  Patient presents with  . Follow-up    24mth follow-up with PSA/ KUB    HPI: Jose Downs is a 54 y.o. male who presents for a 8 month follow up of prostate cancer and combined arterial insufficiency and corporo-venous occulsive erectile dysfunction.   Heinitiallyunderwent prostate biopsy on 08/27/2017 in the setting of an elevated PSA to 4.30 and an abnormal rectal exam. He has a personal history of fluctuating PSA. TRUS vol 36 cc.  Prostate biopsy revealed asingle core Gleason 3+3 involving only 6% of the tissue. The remainder of the biopsies were benign. There were 2 cores with chronic and acute inflammation. An ejaculatory duct cyst was biopsied was noted to be benign.  Prostate MRI on 07/22/2019 showed no findings suspicious for high-grade macroscopic prostate cancer in this patient with known small volume low-grade tumor. PI-RADS 1. Enlargement/nodularity of the central gland, suggesting BPH. Calculated volume 32 mL.  PSA is 5.80 as of 03/27/20.   Patient was seen at [facility] on 03/09/2020 for abdominal pain. Pain radiated down to right testicle. He was having a hard time urinating. UA showed 3+ blood. CT A/P w/ contrast showed 0.5cm obstructing calculus in the proximal right ureter with associated mild right hydroureteronephrosis. Multiple additional small nonobstructing bilateral renal calculi. No evidence of hydronephrosis in the left kidney. Mild circumferential mural thickening of the distal esophagus which may suggest mild esophagitis. Recommend clinical correlation.  He was seen in [facility] ED on 03/11/2020. He had some groin pain and recurrent right lower quadrant pain. He had right flank pain, nausea and vomiting. UA was negative. Renal US showed bilateral  subcentimeter renal calculi measuring up to 0.6 cm in the left kidney inferior pole. Mild right pelviectasis. Proximal right ureteral stone noted on prior CT is not visualized on this exam.   KUB today with evidence of ureteral calculus.  The patient has had 2 stone episodes since the last visit. He denies hematuria but notes his UA's always show blood. He stopped Flomax. He reports some flank and abdominal pain/discomfort. He is taking meloxicam for pain.   PSA trend: Component     Latest Ref Rng & Units 08/06/2017 02/15/2019 07/06/2019 03/27/2020  Prostatic Specific Antigen     0.00 - 4.00 ng/mL 4.30 (H) 3.72 4.00 5.80 (H)     PMH: Past Medical History:  Diagnosis Date  . Hypertension   . Mitral valve regurgitation     Surgical History: Past Surgical History:  Procedure Laterality Date  . HERNIA REPAIR    . HERNIA REPAIR  1982  . KNEE ARTHROSCOPY Left 08/02/2016  . LITHOTRIPSY    . SHOULDER ARTHROSCOPY Right     Home Medications:  Allergies as of 03/31/2020      Reactions   Other Palpitations, Swelling   Nuts/tree fruits/artificial sweeteners Nuts/tree fruits/artificial sweeteners Tree nuts/tree fruits/artificial sweeteners, band aids and or skin glue (blistering and swollen skin)   Tape Rash   Bandage glue      Medication List       Accurate as of March 31, 2020 11:59 PM. If you have any questions, ask your nurse or doctor.        STOP taking these medications   sildenafil 20 MG tablet Commonly known as: Revatio Stopped by: [name], MD     TAKE  these medications   meloxicam 15 MG tablet Commonly known as: MOBIC Take 15 mg by mouth daily as needed.   tamsulosin 0.4 MG Caps capsule Commonly known as: Flomax Take 1 capsule (0.4 mg total) by mouth daily.   valsartan 80 MG tablet Commonly known as: DIOVAN       Allergies:  Allergies  Allergen Reactions  . Other Palpitations and Swelling    Nuts/tree fruits/artificial sweeteners Nuts/tree  fruits/artificial sweeteners Tree nuts/tree fruits/artificial sweeteners, band aids and or skin glue (blistering and swollen skin)  . Tape Rash    Bandage glue    Family History: No family history on file.  Social History:  reports that he has never smoked. He has never used smokeless tobacco. He reports current alcohol use. No history on file for drug use.   Physical Exam: BP (!) 132/92   Pulse 73   Ht 5\' 6"  (1.676 m)   Wt 178 lb (80.7 kg)   BMI 28.73 kg/m   Constitutional:  Alert and oriented, No acute distress. HEENT: [place] AT, moist mucus membranes.  Trachea midline, no masses. Cardiovascular: No clubbing, cyanosis, or edema. Respiratory: Normal respiratory effort, no increased work of breathing. Skin: No rashes, bruises or suspicious lesions. Neurologic: Grossly intact, no focal deficits, moving all 4 extremities. Psychiatric: Normal mood and affect.  Laboratory Data:  Lab Results  Component Value Date   CREATININE 0.90 07/22/2019    Urinalysis Results for orders placed or performed during the hospital encounter of 03/31/20  Urine Culture   Specimen: Urine, Clean Catch  Result Value Ref Range   Specimen Description      URINE, CLEAN CATCH Performed at [facility], [address], [place], [place] [address]    Special Requests      NONE Performed at [facility] [place], [address], [place], [place] [address]    Culture      NO GROWTH Performed at [facility], [place] [address] [place], [place] [address]    Report Status 04/02/2020 FINAL   Urinalysis, Complete w Microscopic  Result Value Ref Range   Color, Urine YELLOW YELLOW   APPearance CLEAR CLEAR   Specific Gravity, Urine 1.010 1.005 - 1.030   pH 5.0 5.0 - 8.0   Glucose, UA NEGATIVE NEGATIVE mg/dL   Hgb urine dipstick TRACE (A) NEGATIVE   Bilirubin Urine NEGATIVE NEGATIVE   Ketones, ur NEGATIVE NEGATIVE mg/dL   Protein, ur NEGATIVE NEGATIVE mg/dL   Nitrite NEGATIVE  NEGATIVE   Leukocytes,Ua NEGATIVE NEGATIVE   Squamous Epithelial / LPF 0-5 0 - 5   WBC, UA 0-5 0 - 5 WBC/hpf   RBC / HPF 0-5 0 - 5 RBC/hpf   Bacteria, UA FEW (A) NONE SEEN     Pertinent Imaging: Narrative & Impression  CLINICAL DATA:  Kidney stones  EXAM: ABDOMEN - 1 VIEW  COMPARISON:  CT 08/10/2018  FINDINGS: Nonobstructed bowel-gas pattern. Multiple bilateral kidney stones. These measure up to 3 mm on the right and up to 4 mm on the left. 4 mm calcification projects over the right mid sacrum.  IMPRESSION: Bilateral kidney stones. 4 mm calcification projects over the right mid sacrum, potential distal ureteral stone.   Electronically Signed   By: Donavan Foil M.D.   On: 03/31/2020 20:52      I have personally reviewed the images and agree with radiologist interpretation.    Assessment & Plan:    1. Prostate cancer History of fluctuating PSA.  Very low risk prostate cancer on active surveillance  Recent prostate MRI is reassuring, will hold off on further biopsy or intervention at this time and plan for continued surveillance with close recheck PSA is mildly elevated at 5.80 as of 03/27/2020 and may be related to recent stone episodes.  RTC in 6 months with PSA/DRE.  2. Ureteral calculus  UA and urine culture sent.   We discussed general stone prevention techniques including drinking plenty water with goal of producing 2.5 L urine daily, increased citric acid intake, avoidance of high oxalate containing foods, and decreased salt intake.  Information about dietary recommendations given today.   Based on size and location of the stone, patient has ~90% of spontaneous interval stone passage over 30-day period.  Given that the stone seems to be moving in location, patient may have a higher chance than the aforementioned.  Medical expulsive therapy was discussed as an option.  We discussed various treatment options including ESWL vs. ureteroscopy, laser  lithotripsy, and stent. We discussed the risks and benefits of both including bleeding, infection, damage to surrounding structures, efficacy with need for possible further intervention, and need for temporary ureteral stent. Patient elected lithotripsy.   He was provided with a strainer and Flomax was refilled.    [facility] [address], [place], [place] [address] [phone]  I, Selena Batten, am acting as a scribe for Dr. [name].  I have reviewed the above documentation for accuracy and completeness, and I agree with the above.   [name], MD

## 2020-03-30 NOTE — H&P (View-Only) (Signed)
03/31/2020 4:19 PM   [name] 1966-05-13 [id number]  Referring provider: Kirk Ruths, MD [facility] [place],  [place] [address] Chief Complaint  Patient presents with  . Follow-up    15mth follow-up with PSA/ KUB    HPI: Jose Downs is a 54 y.o. male who presents for a 8 month follow up of prostate cancer and combined arterial insufficiency and corporo-venous occulsive erectile dysfunction.   Heinitiallyunderwent prostate biopsy on 08/27/2017 in the setting of an elevated PSA to 4.30 and an abnormal rectal exam. He has a personal history of fluctuating PSA. TRUS vol 36 cc.  Prostate biopsy revealed asingle core Gleason 3+3 involving only 6% of the tissue. The remainder of the biopsies were benign. There were 2 cores with chronic and acute inflammation. An ejaculatory duct cyst was biopsied was noted to be benign.  Prostate MRI on 07/22/2019 showed no findings suspicious for high-grade macroscopic prostate cancer in this patient with known small volume low-grade tumor. PI-RADS 1. Enlargement/nodularity of the central gland, suggesting BPH. Calculated volume 32 mL.  PSA is 5.80 as of 03/27/20.   Patient was seen at [facility] care on 03/09/2020 for abdominal pain. Pain radiated down to right testicle. He was having a hard time urinating. UA showed 3+ blood. CT A/P w/ contrast showed 0.5cm obstructing calculus in the proximal right ureter with associated mild right hydroureteronephrosis. Multiple additional small nonobstructing bilateral renal calculi. No evidence of hydronephrosis in the left kidney. Mild circumferential mural thickening of the distal esophagus which may suggest mild esophagitis. Recommend clinical correlation.  He was seen in [facility] ED on 03/11/2020. He had some groin pain and recurrent right lower quadrant pain. He had right flank pain, nausea and vomiting. UA was negative. Renal US showed bilateral  subcentimeter renal calculi measuring up to 0.6 cm in the left kidney inferior pole. Mild right pelviectasis. Proximal right ureteral stone noted on prior CT is not visualized on this exam.   KUB today with evidence of ureteral calculus.  The patient has had 2 stone episodes since the last visit. He denies hematuria but notes his UA's always show blood. He stopped Flomax. He reports some flank and abdominal pain/discomfort. He is taking meloxicam for pain.   PSA trend: Component     Latest Ref Rng & Units 08/06/2017 02/15/2019 07/06/2019 03/27/2020  Prostatic Specific Antigen     0.00 - 4.00 ng/mL 4.30 (H) 3.72 4.00 5.80 (H)     PMH: Past Medical History:  Diagnosis Date  . Hypertension   . Mitral valve regurgitation     Surgical History: Past Surgical History:  Procedure Laterality Date  . HERNIA REPAIR    . HERNIA REPAIR  1982  . KNEE ARTHROSCOPY Left 08/02/2016  . LITHOTRIPSY    . SHOULDER ARTHROSCOPY Right     Home Medications:  Allergies as of 03/31/2020      Reactions   Other Palpitations, Swelling   Nuts/tree fruits/artificial sweeteners Nuts/tree fruits/artificial sweeteners Tree nuts/tree fruits/artificial sweeteners, band aids and or skin glue (blistering and swollen skin)   Tape Rash   Bandage glue      Medication List       Accurate as of March 31, 2020 11:59 PM. If you have any questions, ask your nurse or doctor.        STOP taking these medications   sildenafil 20 MG tablet Commonly known as: Revatio Stopped by: [name], MD     TAKE  these medications   meloxicam 15 MG tablet Commonly known as: MOBIC Take 15 mg by mouth daily as needed.   tamsulosin 0.4 MG Caps capsule Commonly known as: Flomax Take 1 capsule (0.4 mg total) by mouth daily.   valsartan 80 MG tablet Commonly known as: DIOVAN       Allergies:  Allergies  Allergen Reactions  . Other Palpitations and Swelling    Nuts/tree fruits/artificial sweeteners Nuts/tree  fruits/artificial sweeteners Tree nuts/tree fruits/artificial sweeteners, band aids and or skin glue (blistering and swollen skin)  . Tape Rash    Bandage glue    Family History: No family history on file.  Social History:  reports that he has never smoked. He has never used smokeless tobacco. He reports current alcohol use. No history on file for drug use.   Physical Exam: BP (!) 132/92   Pulse 73   Ht 5\' 6"  (1.676 m)   Wt 178 lb (80.7 kg)   BMI 28.73 kg/m   Constitutional:  Alert and oriented, No acute distress. HEENT: [place] AT, moist mucus membranes.  Trachea midline, no masses. Cardiovascular: No clubbing, cyanosis, or edema. Respiratory: Normal respiratory effort, no increased work of breathing. Skin: No rashes, bruises or suspicious lesions. Neurologic: Grossly intact, no focal deficits, moving all 4 extremities. Psychiatric: Normal mood and affect.  Laboratory Data:  Lab Results  Component Value Date   CREATININE 0.90 07/22/2019    Urinalysis Results for orders placed or performed during the hospital encounter of 03/31/20  Urine Culture   Specimen: Urine, Clean Catch  Result Value Ref Range   Specimen Description      URINE, CLEAN CATCH Performed at [facility], [address], [place], [place] [address]    Special Requests      NONE Performed at [facility] [place], [address] [place], [place] [address]    Culture      NO GROWTH Performed at [facility], [place] [address] [place], [place] [address]    Report Status 04/02/2020 FINAL   Urinalysis, Complete w Microscopic  Result Value Ref Range   Color, Urine YELLOW YELLOW   APPearance CLEAR CLEAR   Specific Gravity, Urine 1.010 1.005 - 1.030   pH 5.0 5.0 - 8.0   Glucose, UA NEGATIVE NEGATIVE mg/dL   Hgb urine dipstick TRACE (A) NEGATIVE   Bilirubin Urine NEGATIVE NEGATIVE   Ketones, ur NEGATIVE NEGATIVE mg/dL   Protein, ur NEGATIVE NEGATIVE mg/dL   Nitrite NEGATIVE  NEGATIVE   Leukocytes,Ua NEGATIVE NEGATIVE   Squamous Epithelial / LPF 0-5 0 - 5   WBC, UA 0-5 0 - 5 WBC/hpf   RBC / HPF 0-5 0 - 5 RBC/hpf   Bacteria, UA FEW (A) NONE SEEN     Pertinent Imaging: Narrative & Impression  CLINICAL DATA:  Kidney stones  EXAM: ABDOMEN - 1 VIEW  COMPARISON:  CT 08/10/2018  FINDINGS: Nonobstructed bowel-gas pattern. Multiple bilateral kidney stones. These measure up to 3 mm on the right and up to 4 mm on the left. 4 mm calcification projects over the right mid sacrum.  IMPRESSION: Bilateral kidney stones. 4 mm calcification projects over the right mid sacrum, potential distal ureteral stone.   Electronically Signed   By: Donavan Foil M.D.   On: 03/31/2020 20:52      I have personally reviewed the images and agree with radiologist interpretation.    Assessment & Plan:    1. Prostate cancer History of fluctuating PSA.  Very low risk prostate cancer on active surveillance  Recent prostate MRI is reassuring, will hold off on further biopsy or intervention at this time and plan for continued surveillance with close recheck PSA is mildly elevated at 5.80 as of 03/27/2020 and may be related to recent stone episodes.  RTC in 6 months with PSA/DRE.  2. Ureteral calculus  UA and urine culture sent.   We discussed general stone prevention techniques including drinking plenty water with goal of producing 2.5 L urine daily, increased citric acid intake, avoidance of high oxalate containing foods, and decreased salt intake.  Information about dietary recommendations given today.   Based on size and location of the stone, patient has ~90% of spontaneous interval stone passage over 30-day period.  Given that the stone seems to be moving in location, patient may have a higher chance than the aforementioned.  Medical expulsive therapy was discussed as an option.  We discussed various treatment options including ESWL vs. ureteroscopy, laser  lithotripsy, and stent. We discussed the risks and benefits of both including bleeding, infection, damage to surrounding structures, efficacy with need for possible further intervention, and need for temporary ureteral stent. Patient elected lithotripsy.   He was provided with a strainer and Flomax was refilled.    [facility] [address] [place], [place] [address] [phone]  I, Selena Batten, am acting as a scribe for Dr. [name].  I have reviewed the above documentation for accuracy and completeness, and I agree with the above.   [name], MD

## 2020-03-31 ENCOUNTER — Encounter: Payer: Self-pay | Admitting: Urology

## 2020-03-31 ENCOUNTER — Ambulatory Visit
Admission: RE | Admit: 2020-03-31 | Discharge: 2020-03-31 | Disposition: A | Discharge status: Home / Self Care | Payer: Managed Care, Other (non HMO) | Attending: Urology | Admitting: Urology

## 2020-03-31 ENCOUNTER — Ambulatory Visit (INDEPENDENT_AMBULATORY_CARE_PROVIDER_SITE_OTHER): Payer: Managed Care, Other (non HMO) | Admitting: Urology

## 2020-03-31 ENCOUNTER — Ambulatory Visit: Payer: Self-pay | Admitting: Urology

## 2020-03-31 ENCOUNTER — Ambulatory Visit
Admission: RE | Admit: 2020-03-31 | Discharge: 2020-03-31 | Disposition: A | Discharge status: Home / Self Care | Payer: Managed Care, Other (non HMO) | Source: Ambulatory Visit | Attending: Urology | Admitting: Urology

## 2020-03-31 ENCOUNTER — Other Ambulatory Visit: Payer: Self-pay

## 2020-03-31 VITALS — BP 132/92 | HR 73 | Ht 66.0 in | Wt 178.0 lb

## 2020-03-31 DIAGNOSIS — N200 Calculus of kidney: Secondary | ICD-10-CM

## 2020-03-31 LAB — URINALYSIS, COMPLETE (UACMP) WITH MICROSCOPIC
Bilirubin Urine: NEGATIVE
Glucose, UA: NEGATIVE mg/dL
Ketones, ur: NEGATIVE mg/dL
Leukocytes,Ua: NEGATIVE
Nitrite: NEGATIVE
Protein, ur: NEGATIVE mg/dL
Specific Gravity, Urine: 1.01 (ref 1.005–1.030)
pH: 5 (ref 5.0–8.0)

## 2020-03-31 MED ORDER — TAMSULOSIN HCL 0.4 MG PO CAPS: 0.4000 mg | ORAL_CAPSULE | Freq: Every day | ORAL | 0 refills | Status: DC

## 2020-04-02 LAB — URINE CULTURE: Culture: NO GROWTH

## 2020-04-03 ENCOUNTER — Encounter: Payer: Self-pay | Admitting: Urology

## 2020-04-05 ENCOUNTER — Other Ambulatory Visit: Payer: Self-pay

## 2020-04-05 ENCOUNTER — Other Ambulatory Visit: Payer: Self-pay | Admitting: Urology

## 2020-04-05 ENCOUNTER — Other Ambulatory Visit
Admission: RE | Admit: 2020-04-05 | Discharge: 2020-04-05 | Disposition: A | Discharge status: Home / Self Care | Payer: Managed Care, Other (non HMO) | Source: Ambulatory Visit | Attending: Urology | Admitting: Urology

## 2020-04-05 DIAGNOSIS — Z20822 Contact with and (suspected) exposure to covid-19: Secondary | ICD-10-CM | POA: Diagnosis not present

## 2020-04-05 DIAGNOSIS — N200 Calculus of kidney: Secondary | ICD-10-CM

## 2020-04-05 DIAGNOSIS — Z01812 Encounter for preprocedural laboratory examination: Secondary | ICD-10-CM | POA: Diagnosis present

## 2020-04-05 LAB — SARS CORONAVIRUS 2 (TAT 6-24 HRS): SARS Coronavirus 2: NEGATIVE

## 2020-04-06 ENCOUNTER — Other Ambulatory Visit: Payer: Self-pay

## 2020-04-06 ENCOUNTER — Encounter: Payer: Self-pay | Admitting: Urology

## 2020-04-06 ENCOUNTER — Ambulatory Visit: Payer: Managed Care, Other (non HMO)

## 2020-04-06 ENCOUNTER — Ambulatory Visit
Admission: RE | Admit: 2020-04-06 | Discharge: 2020-04-06 | Disposition: A | Discharge status: Home / Self Care | Payer: Managed Care, Other (non HMO) | Attending: Urology | Admitting: Urology

## 2020-04-06 ENCOUNTER — Encounter
Admission: RE | Disposition: A | Discharge status: Home / Self Care | Payer: Self-pay | Source: Home / Self Care | Attending: Urology

## 2020-04-06 DIAGNOSIS — Z79899 Other long term (current) drug therapy: Secondary | ICD-10-CM | POA: Diagnosis not present

## 2020-04-06 DIAGNOSIS — N5203 Combined arterial insufficiency and corporo-venous occlusive erectile dysfunction: Secondary | ICD-10-CM | POA: Diagnosis not present

## 2020-04-06 DIAGNOSIS — N201 Calculus of ureter: Secondary | ICD-10-CM

## 2020-04-06 DIAGNOSIS — Z8546 Personal history of malignant neoplasm of prostate: Secondary | ICD-10-CM | POA: Insufficient documentation

## 2020-04-06 DIAGNOSIS — I10 Essential (primary) hypertension: Secondary | ICD-10-CM | POA: Insufficient documentation

## 2020-04-06 DIAGNOSIS — N132 Hydronephrosis with renal and ureteral calculous obstruction: Secondary | ICD-10-CM | POA: Insufficient documentation

## 2020-04-06 DIAGNOSIS — Z888 Allergy status to other drugs, medicaments and biological substances status: Secondary | ICD-10-CM | POA: Diagnosis not present

## 2020-04-06 DIAGNOSIS — N200 Calculus of kidney: Secondary | ICD-10-CM

## 2020-04-06 HISTORY — PX: EXTRACORPOREAL SHOCK WAVE LITHOTRIPSY: SHX1557

## 2020-04-06 SURGERY — LITHOTRIPSY, ESWL
Anesthesia: Moderate Sedation | Laterality: Right

## 2020-04-06 MED ORDER — TAMSULOSIN HCL 0.4 MG PO CAPS: 0.4000 mg | ORAL_CAPSULE | Freq: Every day | ORAL | 0 refills | Status: DC

## 2020-04-06 MED ORDER — CIPROFLOXACIN IN D5W 400 MG/200ML IV SOLN: 400.0000 mg | INTRAVENOUS | Status: AC

## 2020-04-06 MED ORDER — CIPROFLOXACIN IN D5W 400 MG/200ML IV SOLN
INTRAVENOUS | Status: AC
  Administered 2020-04-06: 400 mg via INTRAVENOUS
  Filled 2020-04-06: qty 200

## 2020-04-06 MED ORDER — CIPROFLOXACIN HCL 500 MG PO TABS
ORAL_TABLET | ORAL | Status: AC
  Filled 2020-04-06: qty 1

## 2020-04-06 MED ORDER — ONDANSETRON HCL 4 MG/2ML IJ SOLN: 4.0000 mg | Freq: Once | INTRAMUSCULAR | Status: AC | PRN

## 2020-04-06 MED ORDER — DIAZEPAM 5 MG PO TABS: 10.0000 mg | ORAL_TABLET | ORAL | Status: AC

## 2020-04-06 MED ORDER — DIPHENHYDRAMINE HCL 25 MG PO CAPS
ORAL_CAPSULE | ORAL | Status: AC
  Administered 2020-04-06: 25 mg via ORAL
  Filled 2020-04-06: qty 1

## 2020-04-06 MED ORDER — OXYCODONE-ACETAMINOPHEN 5-325 MG PO TABS
1.0000 | ORAL_TABLET | Freq: Four times a day (QID) | ORAL | 0 refills | Status: DC | PRN
Start: 2020-04-06 — End: 2020-04-14

## 2020-04-06 MED ORDER — DIAZEPAM 5 MG PO TABS
ORAL_TABLET | ORAL | Status: AC
  Administered 2020-04-06: 10 mg via ORAL
  Filled 2020-04-06: qty 2

## 2020-04-06 MED ORDER — ONDANSETRON HCL 4 MG/2ML IJ SOLN
INTRAMUSCULAR | Status: AC
  Administered 2020-04-06: 4 mg via INTRAVENOUS
  Filled 2020-04-06: qty 2

## 2020-04-06 MED ORDER — SODIUM CHLORIDE 0.9 % IV SOLN: INTRAVENOUS | Status: DC

## 2020-04-06 MED ORDER — DIPHENHYDRAMINE HCL 25 MG PO CAPS: 25.0000 mg | ORAL_CAPSULE | ORAL | Status: AC

## 2020-04-06 NOTE — Interval H&P Note (Signed)
History and Physical Interval Note:  KUB this AM with migration of a previously noted renal calculus to the mid/distal ureter just proximal to the previously noted calculus  04/06/2020 9:11 AM  Jose Downs  has presented today for surgery, with the diagnosis of Kidney stone.  The various methods of treatment have been discussed with the patient and family. After consideration of risks, benefits and other options for treatment, the patient has consented to  Procedure(s): EXTRACORPOREAL SHOCK WAVE LITHOTRIPSY (ESWL) (Right) as a surgical intervention.  The patient's history has been reviewed, patient examined, no change in status, stable for surgery.  I have reviewed the patient's chart and labs.  Questions were answered to the patient's satisfaction.     [place]

## 2020-04-06 NOTE — Discharge Instructions (Addendum)
   As per the [facility] discharge instructions.  A prescription for pain medication was sent to your pharmacy  Tamsulosin was refilled which will help pass stone fragments  Contact [phone] for any questions or concerns   AMBULATORY SURGERY  DISCHARGE INSTRUCTIONS   1) The drugs that you were given will stay in your system until tomorrow so for the next 24 hours you should not:  A) Drive an automobile B) Make any legal decisions C) Drink any alcoholic beverage   2) You may resume regular meals tomorrow.  Today it is better to start with liquids and gradually work up to solid foods.  You may eat anything you prefer, but it is better to start with liquids, then soup and crackers, and gradually work up to solid foods.   3) Please notify your doctor immediately if you have any unusual bleeding, trouble breathing, redness and pain at the surgery site, drainage, fever, or pain not relieved by medication.    4) Additional Instructions:        Please contact your physician with any problems or Same Day Surgery at [phone], Monday through Friday 6 am to 4 pm, or [place] at [facility] number at [phone].

## 2020-04-07 ENCOUNTER — Encounter: Payer: Self-pay | Admitting: Urology

## 2020-04-09 ENCOUNTER — Ambulatory Visit
Admission: EM | Admit: 2020-04-09 | Discharge: 2020-04-09 | Disposition: A | Discharge status: Home / Self Care | Payer: Managed Care, Other (non HMO) | Attending: Family Medicine | Admitting: Family Medicine

## 2020-04-09 ENCOUNTER — Other Ambulatory Visit: Payer: Self-pay

## 2020-04-09 ENCOUNTER — Encounter: Payer: Self-pay | Admitting: Emergency Medicine

## 2020-04-09 DIAGNOSIS — N201 Calculus of ureter: Secondary | ICD-10-CM | POA: Insufficient documentation

## 2020-04-09 HISTORY — DX: Disorder of kidney and ureter, unspecified: N28.9

## 2020-04-09 LAB — URINALYSIS, COMPLETE (UACMP) WITH MICROSCOPIC
Bacteria, UA: NONE SEEN
Bilirubin Urine: NEGATIVE
Glucose, UA: NEGATIVE mg/dL
Ketones, ur: NEGATIVE mg/dL
Leukocytes,Ua: NEGATIVE
Nitrite: NEGATIVE
Protein, ur: NEGATIVE mg/dL
Specific Gravity, Urine: <1.005 — ABNORMAL LOW (ref 1.005–1.030)
Squamous Epithelial / HPF: NONE SEEN (ref 0–5)
WBC, UA: NONE SEEN WBC/hpf (ref 0–5)
pH: 6 (ref 5.0–8.0)

## 2020-04-09 NOTE — Discharge Instructions (Signed)
You can increase flomax to 0.8 mg daily.  Oxycodone as needed.  Call Urology in the AM.  Take care  Dr. Lacinda Axon

## 2020-04-09 NOTE — ED Triage Notes (Signed)
Patient c/o lower abdominal pain and burning when urinating that started 2am this morning. Patient reports urinary urgency.  Patient reports history of kidney stones.

## 2020-04-09 NOTE — ED Provider Notes (Signed)
[facility]    CSN: [id number] Arrival date & time: 04/09/20  0848      History   Chief Complaint Chief Complaint  Patient presents with  . Abdominal Pain  . Dysuria   HPI   54 year old male presents with the above complaints.  Patient has had ongoing nephrolithiasis and ureterolithiasis.  Recently had lithotripsy on 10/7.  Patient states that since early this morning he has been experiencing suprapubic pain and associated burning when he urinates.  He states that this feels different than his kidney stones.  He states that he has had some ongoing blood in the urine secondary to recent lithotripsy.  Pain 6/10 in severity but gets severe at times.  No fever.  No relieving factors.  No other complaints at this time.  Notes regarding lithotripsy reviewed.  Patient was given antibiotics associated with his lithotripsy.  He was given Cipro.  Additionally, patient's most recent KUB was reviewed.  He has multiple kidney stones.  Calcification was noted overlying the sacrum.  This particular stone was the one that was targeted for the lithotripsy.  Past Medical History:  Diagnosis Date  . Hypertension   . Mitral valve regurgitation   . Renal disorder     Patient Active Problem List   Diagnosis Date Noted  . Family history of ankylosing spondylitis 05/05/2017  . HTN, goal below 140/90 05/05/2017  . Hypercholesterolemia 05/05/2017  . Kidney stones 05/05/2017  . Migraine without aura and without status migrainosus, not intractable 05/05/2017    Past Surgical History:  Procedure Laterality Date  . EXTRACORPOREAL SHOCK WAVE LITHOTRIPSY Right 04/06/2020   Procedure: EXTRACORPOREAL SHOCK WAVE LITHOTRIPSY (ESWL);  Surgeon: Abbie Sons, MD;  Location: [facility] ORS;  Service: Urology;  Laterality: Right;  . HERNIA REPAIR    . HERNIA REPAIR  1982  . KNEE ARTHROSCOPY Left 08/02/2016  . LITHOTRIPSY    . SHOULDER ARTHROSCOPY Right        Home Medications    Prior to  Admission medications   Medication Sig Start Date End Date Taking? Authorizing Provider  tamsulosin (FLOMAX) 0.4 MG CAPS capsule Take 1 capsule (0.4 mg total) by mouth daily. 04/06/20  Yes Stoioff, Ronda Fairly, MD  valsartan (DIOVAN) 80 MG tablet  09/04/16  Yes [provider]  meloxicam (MOBIC) 15 MG tablet Take 15 mg by mouth daily as needed.     [provider]  oxyCODONE-acetaminophen (PERCOCET/ROXICET) 5-325 MG tablet Take 1 tablet by mouth every 6 (six) hours as needed for severe pain. 04/06/20   Stoioff, Ronda Fairly, MD   Social History Social History   Tobacco Use  . Smoking status: Never Smoker  . Smokeless tobacco: Never Used  Vaping Use  . Vaping Use: Never used  Substance Use Topics  . Alcohol use: Yes  . Drug use: Never     Allergies   Other and Tape   Review of Systems Review of Systems  Constitutional: Negative for fever.  Gastrointestinal: Positive for abdominal pain.  Genitourinary: Positive for dysuria.   Physical Exam Triage Vital Signs ED Triage Vitals  Enc Vitals Group     BP 04/09/20 0922 138/89     Pulse Rate 04/09/20 0922 68     Resp 04/09/20 0922 14     Temp 04/09/20 0922 98.1 F (36.7 C)     Temp Source 04/09/20 0922 Oral     SpO2 04/09/20 0922 97 %     Weight 04/09/20 0916 175 lb (79.4 kg)  Height 04/09/20 0916 5\' 6"  (1.676 m)     Head Circumference --      Peak Flow --      Pain Score 04/09/20 0916 6     Pain Loc --      Pain Edu? --      Excl. in [place]? --    Updated Vital Signs BP 138/89 (BP Location: Left Arm)   Pulse 68   Temp 98.1 F (36.7 C) (Oral)   Resp 14   Ht 5\' 6"  (1.676 m)   Wt 79.4 kg   SpO2 97%   BMI 28.25 kg/m   Visual Acuity Right Eye Distance:   Left Eye Distance:   Bilateral Distance:    Right Eye Near:   Left Eye Near:    Bilateral Near:     Physical Exam Vitals and nursing note reviewed.  Constitutional:      General: He is not in acute distress. HENT:     Head: Normocephalic and  atraumatic.  Eyes:     General:        Right eye: No discharge.        Left eye: No discharge.     Conjunctiva/sclera: Conjunctivae normal.  Cardiovascular:     Rate and Rhythm: Normal rate and regular rhythm.     Heart sounds: No murmur heard.   Pulmonary:     Effort: Pulmonary effort is normal.     Breath sounds: Normal breath sounds. No wheezing, rhonchi or rales.  Abdominal:     General: There is no distension.     Palpations: Abdomen is soft.     Comments: Mild tenderness in the suprapubic region.  No CVA tenderness bilaterally.  Neurological:     Mental Status: He is alert.  Psychiatric:        Mood and Affect: Mood normal.        Behavior: Behavior normal.    UC Treatments / Results  Labs (all labs ordered are listed, but only abnormal results are displayed) Labs Reviewed  URINALYSIS, COMPLETE (UACMP) WITH MICROSCOPIC - Abnormal; Notable for the following components:      Result Value   Specific Gravity, Urine <1.005 (*)    Hgb urine dipstick MODERATE (*)    All other components within normal limits    EKG   Radiology No results found.  Procedures Procedures (including critical care time)  Medications Ordered in UC Medications - No data to display  Initial Impression / Assessment and Plan / UC Course  I have reviewed the triage vital signs and the nursing notes.  Pertinent labs & imaging results that were available during my care of the patient were reviewed by me and considered in my medical decision making (see chart for details).    54 year old male presents with dysuria and suprapubic pain.  No flank pain currently.  Urinalysis with moderate blood.  Microscopy with 6-10 red blood cells per high-power field.  No evidence of pyuria or bacteriuria.  Patient symptoms are likely secondary to passage of stone.  Advised to increase Flomax to 0.8 mg daily.  Advised him to use his home pain medication if he needs to.  Follow-up with urology.  Offered KUB and  patient declined.  Final Clinical Impressions(s) / UC Diagnoses   Final diagnoses:  Ureterolithiasis     Discharge Instructions     You can increase flomax to 0.8 mg daily.  Oxycodone as needed.  Call Urology in the AM.  Take care  Dr.  [facility]     ED Prescriptions    None     PDMP not reviewed this encounter.   Thersa Salt [place], [place] 04/09/20 [phone]

## 2020-04-10 ENCOUNTER — Encounter: Payer: Self-pay | Admitting: Urology

## 2020-04-13 ENCOUNTER — Ambulatory Visit
Admission: RE | Admit: 2020-04-13 | Discharge: 2020-04-13 | Disposition: A | Discharge status: Home / Self Care | Payer: Managed Care, Other (non HMO) | Attending: Urology | Admitting: Urology

## 2020-04-13 ENCOUNTER — Other Ambulatory Visit: Payer: Self-pay

## 2020-04-13 ENCOUNTER — Ambulatory Visit
Admission: RE | Admit: 2020-04-13 | Discharge: 2020-04-13 | Disposition: A | Discharge status: Home / Self Care | Payer: Managed Care, Other (non HMO) | Source: Ambulatory Visit | Attending: Urology | Admitting: Urology

## 2020-04-13 DIAGNOSIS — N200 Calculus of kidney: Secondary | ICD-10-CM

## 2020-04-13 NOTE — Progress Notes (Signed)
04/14/2020 4:03 PM   [name] 02-02-1966 [id number]  Referring provider: Kirk Ruths, MD [name],  [name] [address] Chief Complaint  Patient presents with  . Post-op Follow-up    2wk post litho w/KUB    HPI: Jose Downs is a 54 y.o. male who returns 1 week post op ESWL on 04/06/2020 with Dr. Bernardo Heater.   Patient has a long history of kidney stones.   Patient was seen at [facility] on 03/09/2020 for abdominal pain. Pain radiated down to right testicle. He was having a hard time urinating. UA showed 3+ blood. CT A/P w/ contrast showed 0.5cm obstructing calculus in the proximal right ureter with associated mild right hydroureteronephrosis. Multiple additional small nonobstructing bilateral renal calculi. No evidence of hydronephrosis in the left kidney. Mild circumferential mural thickening of the distal esophagus which may suggest mild esophagitis. Recommend clinical correlation.  He was seen in [facility] ED on 03/11/2020. He had some groin pain and recurrent right lower quadrant pain. He had right flank pain, nausea and vomiting. UA was negative. Renal US showed bilateral subcentimeter renal calculi measuring up to 0.6 cm in the left kidney inferior pole. Mild right pelviectasis. Proximal right ureteral stone noted on prior CT is not visualized on this exam.   KUB on 03/31/2020 showed evidence of ureteral calculus.  Patient is s/p ESWL with Dr. Bernardo Heater on 04/06/2020. KUB noted intrarenal calculi. A 4 x 3 mm calcification on the right is now overlying the lateral mid to lower sacrum, slightly more inferior in position compared to recent study. No bowel obstruction or free air.  Today he is doing well. He has been doing well since ESWL on 04/06/2020. He is drinking plenty fluids.   After eating he take a quarter of a TUMS.   KUB yesterday showed no stones in the ureter.    PMH: Past Medical History:    Diagnosis Date  . Hypertension   . Mitral valve regurgitation   . Renal disorder     Surgical History: Past Surgical History:  Procedure Laterality Date  . EXTRACORPOREAL SHOCK WAVE LITHOTRIPSY Right 04/06/2020   Procedure: EXTRACORPOREAL SHOCK WAVE LITHOTRIPSY (ESWL);  Surgeon: Abbie Sons, MD;  Location: [facility] ORS;  Service: Urology;  Laterality: Right;  . HERNIA REPAIR    . HERNIA REPAIR  1982  . KNEE ARTHROSCOPY Left 08/02/2016  . LITHOTRIPSY    . SHOULDER ARTHROSCOPY Right     Home Medications:  Allergies as of 04/14/2020      Reactions   Other Palpitations, Swelling   Nuts/tree fruits/artificial sweeteners Nuts/tree fruits/artificial sweeteners Tree nuts/tree fruits/artificial sweeteners, band aids and or skin glue (blistering and swollen skin)   Tape Rash   Bandage glue      Medication List       Accurate as of April 14, 2020 11:59 PM. If you have any questions, ask your nurse or doctor.        STOP taking these medications   oxyCODONE-acetaminophen 5-325 MG tablet Commonly known as: PERCOCET/ROXICET Stopped by: [name], MD     TAKE these medications   meloxicam 15 MG tablet Commonly known as: MOBIC Take 15 mg by mouth daily as needed.   tamsulosin 0.4 MG Caps capsule Commonly known as: Flomax Take 1 capsule (0.4 mg total) by mouth daily.   valsartan 80 MG tablet Commonly known as: DIOVAN       Allergies:  Allergies  Allergen Reactions  .  Other Palpitations and Swelling    Nuts/tree fruits/artificial sweeteners Nuts/tree fruits/artificial sweeteners Tree nuts/tree fruits/artificial sweeteners, band aids and or skin glue (blistering and swollen skin)  . Tape Rash    Bandage glue    Family History: No family history on file.  Social History:  reports that he has never smoked. He has never used smokeless tobacco. He reports current alcohol use. He reports that he does not use drugs.   Physical Exam: BP 135/89   Pulse 80    Ht 5\' 6"  (1.676 m)   Wt 175 lb (79.4 kg)   BMI 28.25 kg/m   Constitutional:  Alert and oriented, No acute distress. HEENT: [place] AT, moist mucus membranes.  Trachea midline, no masses. Cardiovascular: No clubbing, cyanosis, or edema. Respiratory: Normal respiratory effort, no increased work of breathing. Skin: No rashes, bruises or suspicious lesions. Neurologic: Grossly intact, no focal deficits, moving all 4 extremities. Psychiatric: Normal mood and affect.  Laboratory Data:  Lab Results  Component Value Date   CREATININE 0.90 07/22/2019    Pertinent Imaging: Results for orders placed during the hospital encounter of 04/06/20  DG Abd 1 View  Narrative CLINICAL DATA:  Preoperative lithotripsy.  Nephrolithiasis  EXAM: ABDOMEN - 1 VIEW  COMPARISON:  March 31, 2020  FINDINGS: On the right, there is a 3 x 2 mm calculus in the mid kidney. More inferiorly, there is a 5 x 4 mm apparent calculus on the right. On the left, there is a 3 x 3 mm calculus in the mid kidney. More inferiorly on the left, there is a 4 x 3 mm calculus with an adjacent 3 x 3 mm calculus. There is a 4 x 3 mm calcification overlying the mid sacrum on the right, slightly more inferiorly in position compared to prior study.  Moderate stool in colon. No bowel dilatation or air-fluid level to suggest bowel obstruction. No free air.  IMPRESSION: Intrarenal calculi. A 4 x 3 mm calcification on the right is now overlying the lateral mid to lower sacrum, slightly more inferior in position compared to recent study.  No bowel obstruction or free air.   Electronically Signed By: Lowella Grip III M.D. On: 04/06/2020 08:18  I have personally reviewed the images and agree with radiologist interpretation.      Assessment & Plan:    1. History of kidney stones KUB yesterday showed no stones in the ureter, successful procedure  We discussed general stone prevention techniques including drinking  plenty water with goal of producing 2.5 L urine daily, increased citric acid intake, avoidance of high oxalate containing foods, and decreased salt intake.  Information about dietary recommendations given today.   Stone composition analysis sent.    F/u as previously scheduled for annual f/u  [facility] [address], [place], [place] [address] [phone]  I, Selena Batten, am acting as a scribe for Dr. [name].  I have reviewed the above documentation for accuracy and completeness, and I agree with the above.   [name], MD

## 2020-04-14 ENCOUNTER — Ambulatory Visit: Payer: Self-pay | Admitting: Urology

## 2020-04-14 ENCOUNTER — Other Ambulatory Visit
Admission: RE | Admit: 2020-04-14 | Discharge: 2020-04-14 | Disposition: A | Discharge status: Home / Self Care | Payer: Managed Care, Other (non HMO) | Source: Ambulatory Visit | Attending: Urology | Admitting: Urology

## 2020-04-14 ENCOUNTER — Encounter: Payer: Self-pay | Admitting: Urology

## 2020-04-14 ENCOUNTER — Ambulatory Visit (INDEPENDENT_AMBULATORY_CARE_PROVIDER_SITE_OTHER): Payer: Managed Care, Other (non HMO) | Admitting: Urology

## 2020-04-14 VITALS — BP 135/89 | HR 80 | Ht 66.0 in | Wt 175.0 lb

## 2020-04-14 DIAGNOSIS — N200 Calculus of kidney: Secondary | ICD-10-CM | POA: Insufficient documentation

## 2020-04-19 ENCOUNTER — Telehealth: Payer: Self-pay

## 2020-04-19 LAB — CALCULI, WITH PHOTOGRAPH (CLINICAL LAB)
Calcium Oxalate Dihydrate: 20 %
Calcium Oxalate Monohydrate: 80 %
Weight Calculi: 129 mg

## 2020-04-19 NOTE — Telephone Encounter (Signed)
-----   Message from [name], MD sent at 04/19/2020  1:53 PM EDT ----- Please share stone analysis results, calcium oxalate.  [name], MD

## 2020-04-20 ENCOUNTER — Telehealth: Payer: Self-pay

## 2020-04-20 NOTE — Telephone Encounter (Signed)
Pt aware of results 

## 2020-04-20 NOTE — Telephone Encounter (Signed)
-----   Message from [name], MD sent at 04/19/2020  1:53 PM EDT ----- Please share stone analysis results, calcium oxalate.  [name], MD

## 2020-04-24 ENCOUNTER — Other Ambulatory Visit: Payer: Self-pay | Admitting: Urology

## 2020-05-10 ENCOUNTER — Other Ambulatory Visit: Payer: Self-pay | Admitting: Urology

## 2020-06-04 ENCOUNTER — Other Ambulatory Visit: Payer: Self-pay | Admitting: Urology

## 2020-09-26 ENCOUNTER — Other Ambulatory Visit: Payer: Self-pay

## 2020-09-26 DIAGNOSIS — C61 Malignant neoplasm of prostate: Secondary | ICD-10-CM

## 2020-09-29 ENCOUNTER — Ambulatory Visit: Payer: Managed Care, Other (non HMO) | Admitting: Urology

## 2020-10-13 ENCOUNTER — Ambulatory Visit: Payer: Managed Care, Other (non HMO) | Admitting: Urology

## 2020-10-24 ENCOUNTER — Other Ambulatory Visit
Admission: RE | Admit: 2020-10-24 | Discharge: 2020-10-24 | Disposition: A | Discharge status: Home / Self Care | Payer: Managed Care, Other (non HMO) | Attending: Urology | Admitting: Urology

## 2020-10-24 ENCOUNTER — Other Ambulatory Visit: Payer: Self-pay

## 2020-10-24 DIAGNOSIS — C61 Malignant neoplasm of prostate: Secondary | ICD-10-CM | POA: Insufficient documentation

## 2020-10-24 LAB — PSA: Prostatic Specific Antigen: 4.84 ng/mL — ABNORMAL HIGH (ref 0.00–4.00)

## 2020-10-27 ENCOUNTER — Ambulatory Visit: Payer: Managed Care, Other (non HMO) | Admitting: Urology

## 2020-11-10 ENCOUNTER — Encounter: Payer: Self-pay | Admitting: Urology

## 2020-11-10 ENCOUNTER — Other Ambulatory Visit: Payer: Self-pay

## 2020-11-10 ENCOUNTER — Ambulatory Visit (INDEPENDENT_AMBULATORY_CARE_PROVIDER_SITE_OTHER): Payer: Managed Care, Other (non HMO) | Admitting: Urology

## 2020-11-10 VITALS — BP 131/73 | HR 73 | Ht 66.0 in | Wt 174.0 lb

## 2020-11-10 DIAGNOSIS — Z87442 Personal history of urinary calculi: Secondary | ICD-10-CM | POA: Diagnosis not present

## 2020-11-10 DIAGNOSIS — C61 Malignant neoplasm of prostate: Secondary | ICD-10-CM

## 2020-11-10 NOTE — Patient Instructions (Signed)
Prostate Cancer Screening  Prostate cancer screening is a test that is done to check for the presence of prostate cancer in men. The prostate gland is a walnut-sized gland that is located below the bladder and in front of the rectum in males. The function of the prostate is to add fluid to semen during ejaculation. Prostate cancer is the second most common type of cancer in men. Who should have prostate cancer screening?  Screening recommendations vary based on age and other risk factors. Screening is recommended if:  You are older than age 55. If you are age 55-69, talk with your health care provider about your need for screening and how often screening should be done. Because most prostate cancers are slow growing and will not cause death, screening is generally reserved in this age group for men who have a 10-15-year life expectancy.  You are younger than age 55, and you have these risk factors: ? Being a black male or a male of African descent. ? Having a father, brother, or uncle who has been diagnosed with prostate cancer. The risk is higher if your family member's cancer occurred at an early age. Screening is not recommended if:  You are younger than age 40.  You are between the ages of 40 and 54 and you have no risk factors.  You are 70 years of age or older. At this age, the risks that screening can cause are greater than the benefits that it may provide. If you are at high risk for prostate cancer, your health care provider may recommend that you have screenings more often or that you start screening at a younger age. How is screening for prostate cancer done? The recommended prostate cancer screening test is a blood test called the prostate-specific antigen (PSA) test. PSA is a protein that is made in the prostate. As you age, your prostate naturally produces more PSA. Abnormally high PSA levels may be caused by:  Prostate cancer.  An enlarged prostate that is not caused by cancer  (benign prostatic hyperplasia, BPH). This condition is very common in older men.  A prostate gland infection (prostatitis). Depending on the PSA results, you may need more tests, such as:  A physical exam to check the size of your prostate gland.  Blood and imaging tests.  A procedure to remove tissue samples from your prostate gland for testing (biopsy). What are the benefits of prostate cancer screening?  Screening can help to identify cancer at an early stage, before symptoms start and when the cancer can be treated more easily.  There is a small chance that screening may lower your risk of dying from prostate cancer. The chance is small because prostate cancer is a slow-growing cancer, and most men with prostate cancer die from a different cause. What are the risks of prostate cancer screening? The main risk of prostate cancer screening is diagnosing and treating prostate cancer that would never have caused any symptoms or problems. This is called overdiagnosisand overtreatment. PSA screening cannot tell you if your PSA is high due to cancer or a different cause. A prostate biopsy is the only procedure to diagnose prostate cancer. Even the results of a biopsy may not tell you if your cancer needs to be treated. Slow-growing prostate cancer may not need any treatment other than monitoring, so diagnosing and treating it may cause unnecessary stress or other side effects. A prostate biopsy may also cause:  Infection or fever.  A false negative. This is   a result that shows that you do not have prostate cancer when you actually do have prostate cancer. Questions to ask your health care provider  When should I start prostate cancer screening?  What is my risk for prostate cancer?  How often do I need screening?  What type of screening tests do I need?  How do I get my test results?  What do my results mean?  Do I need treatment? Where to find more information  The American Cancer  Society: www.cancer.org  American Urological Association: www.auanet.org Contact a health care provider if:  You have difficulty urinating.  You have pain when you urinate or ejaculate.  You have blood in your urine or semen.  You have pain in your back or in the area of your prostate. Summary  Prostate cancer is a common type of cancer in men. The prostate gland is located below the bladder and in front of the rectum. This gland adds fluid to semen during ejaculation.  Prostate cancer screening may identify cancer at an early stage, when the cancer can be treated more easily.  The prostate-specific antigen (PSA) test is the recommended screening test for prostate cancer.  Discuss the risks and benefits of prostate cancer screening with your health care provider. If you are age 42 or older, the risks that screening can cause are greater than the benefits that it may provide. This information is not intended to replace advice given to you by your health care provider. Make sure you discuss any questions you have with your health care provider. Document Revised: 10/08/2019 Document Reviewed: 01/28/2019 Elsevier Patient Education  [place].

## 2020-11-10 NOTE — Progress Notes (Signed)
11/10/2020 3:24 PM   Jose Downs 1966-05-28 [id number]  Referring provider: Kirk Ruths, MD [name] [place],  [place] [address]  Chief Complaint  Patient presents with  . Prostate Cancer    1year follow up    HPI: 55 year old male with a personal history of nephrolithiasis and low risk prostate cancer on active surveillance who presents today for routine follow-up.  Heinitiallyunderwent prostate biopsy on 08/27/2017 in the setting of an elevated PSA to 4.30 and an abnormal rectal exam. He has a personal history of fluctuating PSA. TRUS vol 36 cc.  Prostate biopsy revealed asingle core Gleason 3+3 involving only 6% of the tissue. The remainder of the biopsies were benign. There were 2 cores with chronic and acute inflammation. An ejaculatory duct cyst was biopsied was noted to be benign.  Prostate MRI on 07/22/2019 showed no findings suspicious for high-grade macroscopic prostate cancer in this patient with known small volume low-grade tumor. PI-RADS 1. Enlargement/nodularity of the central gland, suggesting BPH. Calculated volume 32 mL  Today, he reports that he occasionally has a weaker stream but it eventually becomes more full.  This does not bother him.  He also has occasional urgency which he is able to suppress.  This is rare.  Overall, he has minimal urinary symptoms.  He also has a personal history of nephrolithiasis.  He had acute stone event in 04/2019 when she was able to pass spontaneously.  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  Has not had a recurrent stone event since last visit.   Component     Latest Ref Rng & Units 08/06/2017 02/15/2019 07/06/2019 03/27/2020  Prostatic Specific Antigen     0.00 - 4.00 ng/mL 4.30 (H) 3.72 4.00 5.80 (H)   Component     Latest Ref Rng & Units 10/24/2020  Prostatic Specific Antigen     0.00 - 4.00 ng/mL 4.84 (H)    PMH: Past Medical History:  Diagnosis Date  .  Hypertension   . Mitral valve regurgitation   . Renal disorder     Surgical History: Past Surgical History:  Procedure Laterality Date  . EXTRACORPOREAL SHOCK WAVE LITHOTRIPSY Right 04/06/2020   Procedure: EXTRACORPOREAL SHOCK WAVE LITHOTRIPSY (ESWL);  Surgeon: Abbie Sons, MD;  Location: [facility] ORS;  Service: Urology;  Laterality: Right;  . HERNIA REPAIR    . HERNIA REPAIR  1982  . KNEE ARTHROSCOPY Left 08/02/2016  . LITHOTRIPSY    . SHOULDER ARTHROSCOPY Right     Home Medications:  Allergies as of 11/10/2020      Reactions   Other Palpitations, Swelling   Nuts/tree fruits/artificial sweeteners Nuts/tree fruits/artificial sweeteners Tree nuts/tree fruits/artificial sweeteners, band aids and or skin glue (blistering and swollen skin)   Tape Rash   Bandage glue      Medication List       Accurate as of Nov 10, 2020  3:24 PM. If you have any questions, ask your nurse or doctor.        STOP taking these medications   tamsulosin 0.4 MG Caps capsule Commonly known as: FLOMAX Stopped by: [name], MD     TAKE these medications   meloxicam 15 MG tablet Commonly known as: MOBIC Take 15 mg by mouth daily as needed.   montelukast 10 MG tablet Commonly known as: SINGULAIR Take 1 tablet by mouth at bedtime.   rosuvastatin 20 MG tablet Commonly known as: CRESTOR Take 20 mg by mouth at bedtime.  valsartan 80 MG tablet Commonly known as: DIOVAN       Allergies:  Allergies  Allergen Reactions  . Other Palpitations and Swelling    Nuts/tree fruits/artificial sweeteners Nuts/tree fruits/artificial sweeteners Tree nuts/tree fruits/artificial sweeteners, band aids and or skin glue (blistering and swollen skin)  . Tape Rash    Bandage glue    Family History: Family History  Problem Relation Age of Onset  . Prostate cancer Neg Hx   . Bladder Cancer Neg Hx   . Kidney cancer Neg Hx     Social History:  reports that he has never smoked. He has never used  smokeless tobacco. He reports current alcohol use. He reports that he does not use drugs.   Physical Exam: BP 131/73   Pulse 73   Ht 5\' 6"  (1.676 m)   Wt 174 lb (78.9 kg)   BMI 28.08 kg/m   Constitutional:  Alert and oriented, No acute distress. HEENT: [place] AT, moist mucus membranes.  Trachea midline, no masses. Cardiovascular: No clubbing, cyanosis, or edema. Respiratory: Normal respiratory effort, no increased work of breathing. Rectal: Normal sphincter tone.  30 cc prostate, nontender, no nodules Skin: No rashes, bruises or suspicious lesions. Neurologic: Grossly intact, no focal deficits, moving all 4 extremities. Psychiatric: Normal mood and affect.  Assessment & Plan:    1. Prostate cancer [facility]) PSA today is benign and then his previous normal ranges, will continue active surveillance with a PSA every 6 months and a DRE annually unless his PSA begins to rise or he develops any abnormality on rectal exam  He is agreeable this plan  - PSA; Future - PSA; Future  2. History of kidney stones Asymptomatic, no further stone episodes   F/u psa only in 6 months, DRE/PSA with 1 year with MD [name], MD  [place] [address], [place], [place] [address] [phone]

## 2021-01-05 IMAGING — CR DG ABDOMEN 1V
1 series · 2 of 2 positions shown · non-contrast
Comparison: March 31, 2020

CLINICAL DATA: Preoperative lithotripsy.  Nephrolithiasis

EXAM:
ABDOMEN - 1 VIEW

[Series 1: t abdomen supine · 0.14mm/px · 2 of 2 slices shown]
[im 1/2]
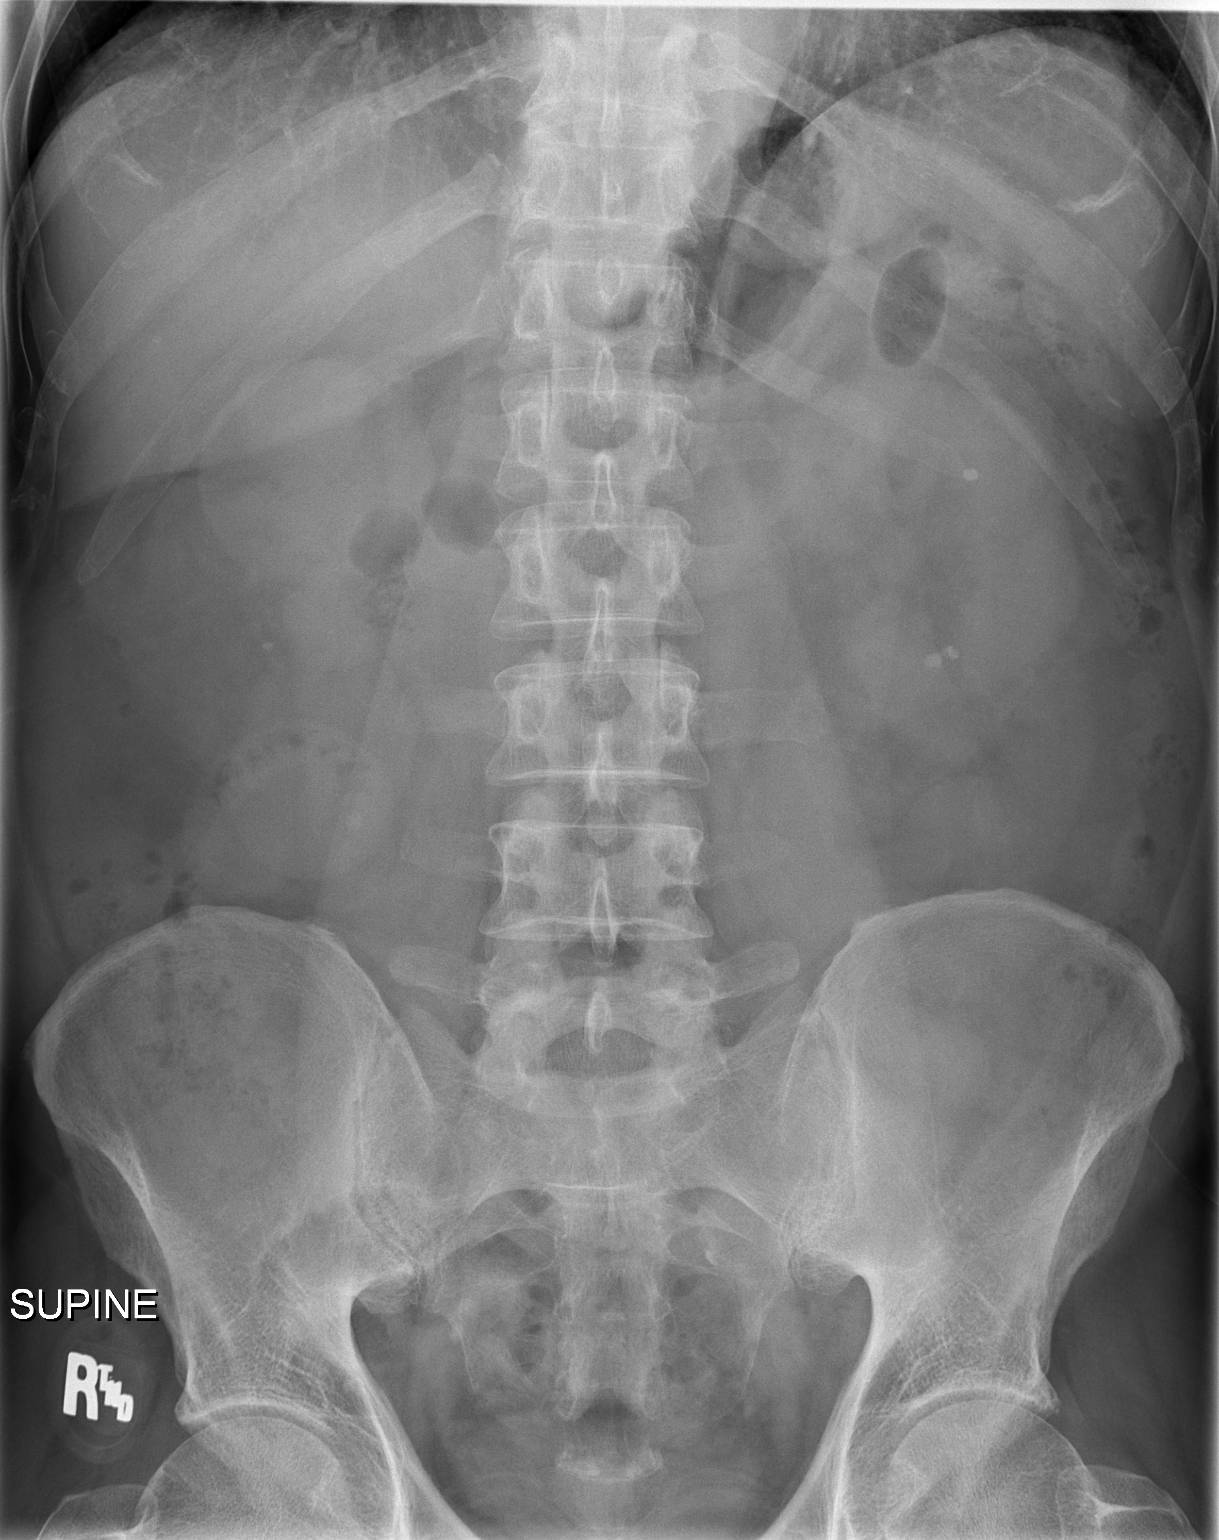
[im 2/2]
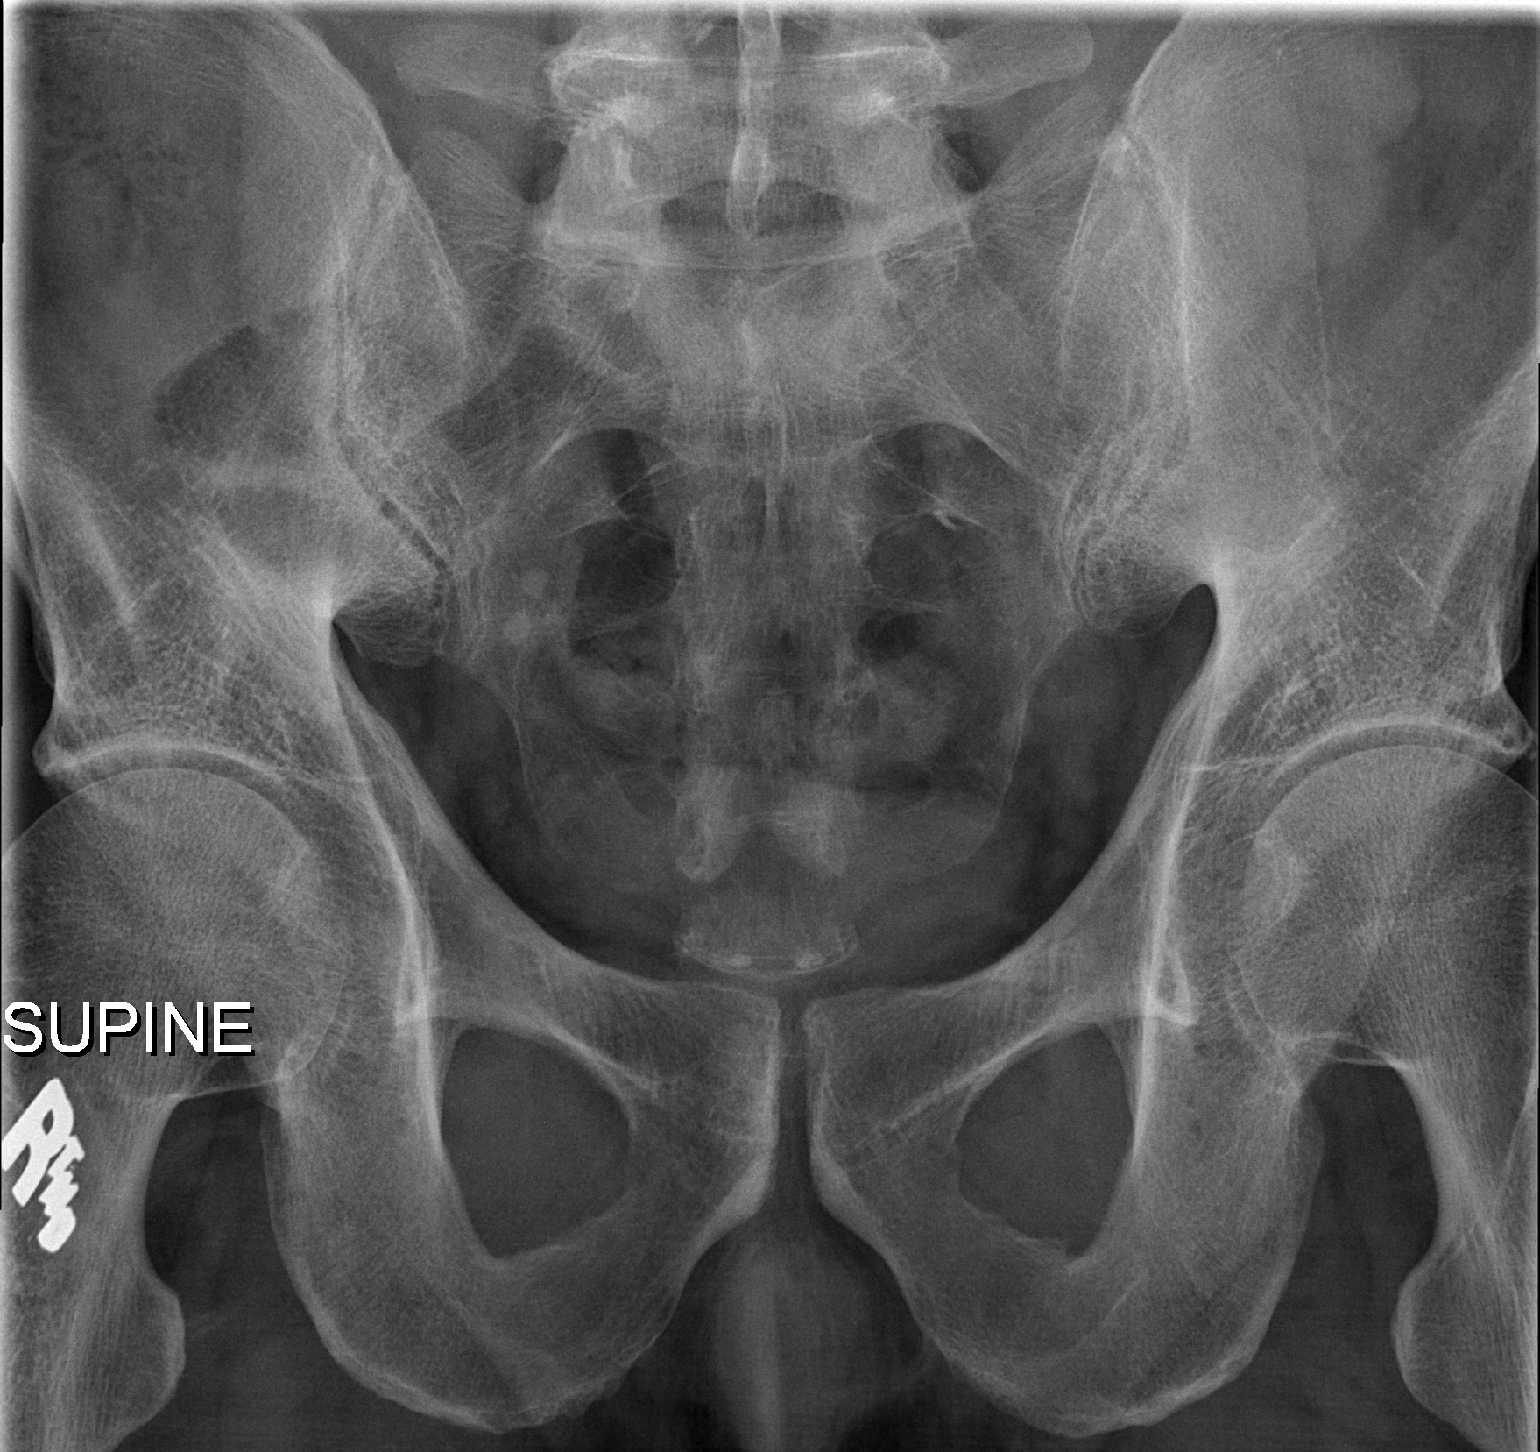

[2 of 2 positions shown; findings below may reference images not displayed]

FINDINGS: On the right, there is a 3 x 2 mm calculus in the mid kidney. More
inferiorly, there is a 5 x 4 mm apparent calculus on the right. On
the left, there is a 3 x 3 mm calculus in the mid kidney. More
inferiorly on the left, there is a 4 x 3 mm calculus with an
adjacent 3 x 3 mm calculus. There is a 4 x 3 mm calcification
overlying the mid sacrum on the right, slightly more inferiorly in
position compared to prior study.

Moderate stool in colon. No bowel dilatation or air-fluid level to
suggest bowel obstruction. No free air.
IMPRESSION: Intrarenal calculi. A 4 x 3 mm calcification on the right is now
overlying the lateral mid to lower sacrum, slightly more inferior in
position compared to recent study.

No bowel obstruction or free air.

## 2021-04-09 ENCOUNTER — Encounter: Payer: Self-pay | Admitting: *Deleted

## 2021-04-10 ENCOUNTER — Encounter
Admission: RE | Disposition: A | Discharge status: Home / Self Care | Payer: Self-pay | Source: Home / Self Care | Attending: Gastroenterology

## 2021-04-10 ENCOUNTER — Encounter: Payer: Self-pay | Admitting: *Deleted

## 2021-04-10 ENCOUNTER — Ambulatory Visit: Payer: Managed Care, Other (non HMO) | Admitting: Anesthesiology

## 2021-04-10 ENCOUNTER — Ambulatory Visit
Admission: RE | Admit: 2021-04-10 | Discharge: 2021-04-10 | Disposition: A | Discharge status: Home / Self Care | Payer: Managed Care, Other (non HMO) | Attending: Gastroenterology | Admitting: Gastroenterology

## 2021-04-10 DIAGNOSIS — E785 Hyperlipidemia, unspecified: Secondary | ICD-10-CM | POA: Diagnosis not present

## 2021-04-10 DIAGNOSIS — Z91048 Other nonmedicinal substance allergy status: Secondary | ICD-10-CM | POA: Insufficient documentation

## 2021-04-10 DIAGNOSIS — Z7982 Long term (current) use of aspirin: Secondary | ICD-10-CM | POA: Diagnosis not present

## 2021-04-10 DIAGNOSIS — E7800 Pure hypercholesterolemia, unspecified: Secondary | ICD-10-CM | POA: Diagnosis not present

## 2021-04-10 DIAGNOSIS — Z1211 Encounter for screening for malignant neoplasm of colon: Secondary | ICD-10-CM | POA: Diagnosis not present

## 2021-04-10 DIAGNOSIS — Z79899 Other long term (current) drug therapy: Secondary | ICD-10-CM | POA: Diagnosis not present

## 2021-04-10 DIAGNOSIS — Z888 Allergy status to other drugs, medicaments and biological substances status: Secondary | ICD-10-CM | POA: Insufficient documentation

## 2021-04-10 DIAGNOSIS — Z791 Long term (current) use of non-steroidal anti-inflammatories (NSAID): Secondary | ICD-10-CM | POA: Diagnosis not present

## 2021-04-10 DIAGNOSIS — Z91018 Allergy to other foods: Secondary | ICD-10-CM | POA: Diagnosis not present

## 2021-04-10 DIAGNOSIS — I10 Essential (primary) hypertension: Secondary | ICD-10-CM | POA: Diagnosis not present

## 2021-04-10 HISTORY — DX: Migraine without aura, not intractable, without status migrainosus: G43.009

## 2021-04-10 HISTORY — DX: Personal history of urinary calculi: Z87.442

## 2021-04-10 HISTORY — PX: COLONOSCOPY WITH PROPOFOL: SHX5780

## 2021-04-10 HISTORY — DX: Pure hypercholesterolemia, unspecified: E78.00

## 2021-04-10 HISTORY — DX: Family history of other diseases of the musculoskeletal system and connective tissue: Z82.69

## 2021-04-10 LAB — GLUCOSE, CAPILLARY: Glucose-Capillary: 91 mg/dL (ref 70–99)

## 2021-04-10 SURGERY — COLONOSCOPY WITH PROPOFOL
Anesthesia: General

## 2021-04-10 MED ORDER — SODIUM CHLORIDE 0.9 % IV SOLN: INTRAVENOUS | Status: DC

## 2021-04-10 MED ORDER — PROPOFOL 500 MG/50ML IV EMUL
INTRAVENOUS | Status: AC
  Filled 2021-04-10: qty 50

## 2021-04-10 MED ORDER — PROPOFOL 500 MG/50ML IV EMUL
INTRAVENOUS | Status: DC | PRN
  Administered 2021-04-10: 150 ug/kg/min via INTRAVENOUS

## 2021-04-10 NOTE — Op Note (Signed)
[facility] Gastroenterology Patient Name: Jose Downs Procedure Date: 04/10/2021 7:11 AM MRN: [id number] Account #: [id number] Date of Birth: 1966-05-17 Admit Type: Outpatient Age: 55 Room: [facility] ENDO ROOM 1 Gender: Male Note Status: Finalized Instrument Name: Colonoscope [id number] Procedure:             Colonoscopy Indications:           Screening for colorectal malignant neoplasm Providers:             Andrey Farmer MD, MD Referring MD:          Ocie Cornfield. Ouida Sills MD, MD (Referring MD) Medicines:             Monitored Anesthesia Care Complications:         No immediate complications. Procedure:             Pre-Anesthesia Assessment:                        - Prior to the procedure, a History and Physical was                         performed, and patient medications and allergies were                         reviewed. The patient is competent. The risks and                         benefits of the procedure and the sedation options and                         risks were discussed with the patient. All questions                         were answered and informed consent was obtained.                         Patient identification and proposed procedure were                         verified by the physician, the nurse, the anesthetist                         and the technician in the endoscopy suite. Mental                         Status Examination: alert and oriented. Airway                         Examination: normal oropharyngeal airway and neck                         mobility. Respiratory Examination: clear to                         auscultation. CV Examination: normal. Prophylactic                         Antibiotics: The patient does not require prophylactic  antibiotics. Prior Anticoagulants: The patient has                         taken no previous anticoagulant or antiplatelet                         agents. ASA Grade  Assessment: II - A patient with mild                         systemic disease. After reviewing the risks and                         benefits, the patient was deemed in satisfactory                         condition to undergo the procedure. The anesthesia                         plan was to use monitored anesthesia care (MAC).                         Immediately prior to administration of medications,                         the patient was re-assessed for adequacy to receive                         sedatives. The heart rate, respiratory rate, oxygen                         saturations, blood pressure, adequacy of pulmonary                         ventilation, and response to care were monitored                         throughout the procedure. The physical status of the                         patient was re-assessed after the procedure.                        After obtaining informed consent, the colonoscope was                         passed under direct vision. Throughout the procedure,                         the patient's blood pressure, pulse, and oxygen                         saturations were monitored continuously. The                         Colonoscope was introduced through the anus and                         advanced to the the cecum, identified by appendiceal  orifice and ileocecal valve. The colonoscopy was                         performed without difficulty. The patient tolerated                         the procedure well. The quality of the bowel                         preparation was good. Findings:      The perianal and digital rectal examinations were normal.      The entire examined colon appeared normal on direct and retroflexion       views. Impression:            - The entire examined colon is normal on direct and                         retroflexion views.                        - No specimens collected. Recommendation:        -  Discharge patient to home.                        - Resume previous diet.                        - Continue present medications.                        - Repeat colonoscopy in 10 years for screening                         purposes.                        - Return to referring physician as previously                         scheduled. Procedure Code(s):     --- Professional ---                        I5038, Colorectal cancer screening; colonoscopy on                         individual not meeting criteria for high risk Diagnosis Code(s):     --- Professional ---                        Z12.11, Encounter for screening for malignant neoplasm                         of colon CPT copyright 2019 American Medical Association. All rights reserved. The codes documented in this report are preliminary and upon coder review may  be revised to meet current compliance requirements. Andrey Farmer MD, MD 04/10/2021 8:04:38 AM Number of Addenda: 0 Note Initiated On: 04/10/2021 7:11 AM Scope Withdrawal Time: 0 hours 7 minutes 45 seconds  Total Procedure Duration: 0 hours 11 minutes 51 seconds  Estimated Blood Loss:  Estimated blood loss: none.  [facility]

## 2021-04-10 NOTE — Transfer of Care (Signed)
Immediate Anesthesia Transfer of Care Note  Patient: Jose Downs  Procedure(s) Performed: COLONOSCOPY WITH PROPOFOL  Patient Location: PACU  Anesthesia Type:General  Level of Consciousness: awake and sedated  Airway & Oxygen Therapy: Patient Spontanous Breathing and Patient connected to nasal cannula oxygen  Post-op Assessment: Report given to RN and Post -op Vital signs reviewed and stable  Post vital signs: Reviewed and stable  Last Vitals:  Vitals Value Taken Time  BP    Temp    Pulse    Resp    SpO2      Last Pain:  Vitals:   04/10/21 0659  TempSrc: Temporal         Complications: No notable events documented.

## 2021-04-10 NOTE — Anesthesia Procedure Notes (Signed)
Date/Time: 04/10/2021 7:47 AM Performed by: Vaughan Sine Pre-anesthesia Checklist: Patient identified, Emergency Drugs available, Suction available, Patient being monitored and Timeout performed Oxygen Delivery Method: Nasal cannula Preoxygenation: Pre-oxygenation with 100% oxygen Induction Type: IV induction Placement Confirmation: positive ETCO2 and CO2 detector

## 2021-04-10 NOTE — Anesthesia Preprocedure Evaluation (Addendum)
Anesthesia Evaluation  Patient identified by MRN, date of birth, ID band Patient awake    Reviewed: Allergy & Precautions, NPO status , Patient's Chart, lab work & pertinent test results  Airway Mallampati: II  TM Distance: >3 FB Neck ROM: Full    Dental no notable dental hx.    Pulmonary neg pulmonary ROS,    Pulmonary exam normal        Cardiovascular hypertension, Pt. on medications Normal cardiovascular exam+ Valvular Problems/Murmurs MR      Neuro/Psych  Headaches, negative psych ROS   GI/Hepatic negative GI ROS, Neg liver ROS, Bowel prep,  Endo/Other  negative endocrine ROS  Renal/GU Renal disease (Kidney Stones)  negative genitourinary   Musculoskeletal negative musculoskeletal ROS (+)   Abdominal   Peds negative pediatric ROS (+)  Hematology negative hematology ROS (+)   Anesthesia Other Findings Hypertension    Mitral valve regurgitation   Renal disorder       Reproductive/Obstetrics negative OB ROS                            Anesthesia Physical Anesthesia Plan  ASA: 2  Anesthesia Plan: General   Post-op Pain Management:    Induction: Intravenous  PONV Risk Score and Plan: 2 and Propofol infusion and TIVA  Airway Management Planned: Natural Airway and Nasal Cannula  Additional Equipment:   Intra-op Plan:   Post-operative Plan:   Informed Consent: I have reviewed the patients History and Physical, chart, labs and discussed the procedure including the risks, benefits and alternatives for the proposed anesthesia with the patient or authorized representative who has indicated his/her understanding and acceptance.       Plan Discussed with: CRNA, Surgeon and Anesthesiologist  Anesthesia Plan Comments:         Anesthesia Quick Evaluation

## 2021-04-10 NOTE — Interval H&P Note (Signed)
History and Physical Interval Note:  04/10/2021 7:46 AM  Jose Downs  has presented today for surgery, with the diagnosis of Z12.11 Colon Cancer Screening.  The various methods of treatment have been discussed with the patient and family. After consideration of risks, benefits and other options for treatment, the patient has consented to  Procedure(s): COLONOSCOPY WITH PROPOFOL (N/A) as a surgical intervention.  The patient's history has been reviewed, patient examined, no change in status, stable for surgery.  I have reviewed the patient's chart and labs.  Questions were answered to the patient's satisfaction.     Lesly Rubenstein  Ok to proceed with colonoscopy

## 2021-04-10 NOTE — H&P (Signed)
Outpatient short stay form Pre-procedure 04/10/2021  Jose Rubenstein, MD  Primary Physician: Kirk Ruths, MD  Reason for visit:  Screening colonoscopy  History of present illness:   55 y/o gentleman with history of hypertension and HLD here for screening colonoscopy. Had colonoscopy 12 years ago that was normal. No blood thinners. History of inguinal hernia repair. No family history of GI malignancies.    Current Facility-Administered Medications:    0.9 %  sodium chloride infusion, , Intravenous, Continuous, Aura Bibby, Hilton Cork, MD, Last Rate: 20 mL/hr at 04/10/21 0744, Continued from Pre-op at 04/10/21 0744  Medications Prior to Admission  Medication Sig Dispense Refill Last Dose   aspirin EC 81 MG tablet Take 81 mg by mouth daily. Swallow whole.   Past Week   Aspirin-Acetaminophen-Caffeine (EXCEDRIN EXTRA STRENGTH PO) Take by mouth 2 (two) times a week. Saturday and Sunday   Past Week   Coenzyme Q10 (CO Q10 PO) Take by mouth daily.      meloxicam (MOBIC) 15 MG tablet Take 15 mg by mouth daily as needed.    Past Month   olopatadine (PATANOL) 0.1 % ophthalmic solution Place 1 drop into the right eye 2 (two) times daily.      tamsulosin (FLOMAX) 0.4 MG CAPS capsule Take 0.4 mg by mouth daily.      valsartan (DIOVAN) 80 MG tablet   0 04/10/2021   montelukast (SINGULAIR) 10 MG tablet Take 1 tablet by mouth at bedtime.      rosuvastatin (CRESTOR) 20 MG tablet Take 20 mg by mouth at bedtime. (Patient not taking: Reported on 04/10/2021)   Not Taking     Allergies  Allergen Reactions   Other Palpitations and Swelling    Nuts/tree fruits/artificial sweeteners Nuts/tree fruits/artificial sweeteners Tree nuts/tree fruits/artificial sweeteners, band aids and or skin glue (blistering and swollen skin)   Tape Rash    Bandage glue     Past Medical History:  Diagnosis Date   Family history of ankylosing spondylitis    History of kidney stones    Hypercholesterolemia     Hypertension    Migraine without aura and without status migrainosus, not intractable    Mitral valve regurgitation    Renal disorder     Review of systems:  Otherwise negative.    Physical Exam  Gen: Alert, oriented. Appears stated age.  HEENT: PERRLA. Lungs: No respiratory distress CV: RRR Abd: soft, benign, no masses Ext: No edema    Planned procedures: Proceed with colonoscopy. The patient understands the nature of the planned procedure, indications, risks, alternatives and potential complications including but not limited to bleeding, infection, perforation, damage to internal organs and possible oversedation/side effects from anesthesia. The patient agrees and gives consent to proceed.  Please refer to procedure notes for findings, recommendations and patient disposition/instructions.     Jose Rubenstein, MD [facility] Gastroenterology

## 2021-04-10 NOTE — Anesthesia Postprocedure Evaluation (Signed)
Anesthesia Post Note  Patient: Jose Downs  Procedure(s) Performed: COLONOSCOPY WITH PROPOFOL  Patient location during evaluation: Phase II Anesthesia Type: General Level of consciousness: awake and alert, awake and oriented Pain management: pain level controlled Vital Signs Assessment: post-procedure vital signs reviewed and stable Respiratory status: spontaneous breathing, nonlabored ventilation and respiratory function stable Cardiovascular status: blood pressure returned to baseline and stable Postop Assessment: no apparent nausea or vomiting Anesthetic complications: no   No notable events documented.   Last Vitals:  Vitals:   04/10/21 0816 04/10/21 0826  BP: (!) 88/68 107/75  Pulse: 70 (!) 58  Resp: 18 17  Temp:    SpO2: 97% 98%    Last Pain:  Vitals:   04/10/21 0826  TempSrc:   PainSc: 0-No pain                 Phill Mutter

## 2021-04-12 ENCOUNTER — Encounter: Payer: Self-pay | Admitting: Gastroenterology

## 2021-11-05 ENCOUNTER — Other Ambulatory Visit
Admission: RE | Admit: 2021-11-05 | Discharge: 2021-11-05 | Disposition: A | Discharge status: Home / Self Care | Payer: Managed Care, Other (non HMO) | Attending: Urology | Admitting: Urology

## 2021-11-05 DIAGNOSIS — C61 Malignant neoplasm of prostate: Secondary | ICD-10-CM | POA: Diagnosis present

## 2021-11-05 LAB — PSA: Prostatic Specific Antigen: 6.19 ng/mL — ABNORMAL HIGH (ref 0.00–4.00)

## 2021-11-09 ENCOUNTER — Encounter: Payer: Self-pay | Admitting: Urology

## 2021-11-09 ENCOUNTER — Ambulatory Visit (INDEPENDENT_AMBULATORY_CARE_PROVIDER_SITE_OTHER): Payer: Managed Care, Other (non HMO) | Admitting: Urology

## 2021-11-09 VITALS — BP 111/76 | HR 99 | Ht 66.5 in | Wt 172.0 lb

## 2021-11-09 DIAGNOSIS — N529 Male erectile dysfunction, unspecified: Secondary | ICD-10-CM

## 2021-11-09 DIAGNOSIS — R3911 Hesitancy of micturition: Secondary | ICD-10-CM | POA: Diagnosis not present

## 2021-11-09 DIAGNOSIS — Z8546 Personal history of malignant neoplasm of prostate: Secondary | ICD-10-CM

## 2021-11-09 DIAGNOSIS — C61 Malignant neoplasm of prostate: Secondary | ICD-10-CM

## 2021-11-09 NOTE — Progress Notes (Signed)
? ?11/09/21 ?8:20 AM  ? ?Jose Downs ?[birth year] ?[id number] ? ?Referring provider:  ?Kirk Ruths, MD ?[place],  [place] [address] ?Chief Complaint  ?Patient presents with  ? Follow-up  ? Prostate Cancer  ? ? ? ?HPI: ?Jose Downs is a 56 y.o.male with a personal history of nephrolithiasis and low risk prostate cancer on active surveillance who presents today for a 1 year follow-up with IPSS, PSA, and DRE.  ? ? ?He initially underwent prostate biopsy on 08/27/2017 in the setting of an elevated PSA to 4.30 and an abnormal rectal exam.  He has a personal history of fluctuating PSA. TRUS vol 36 cc. ?  ?Prostate biopsy revealed a single core Gleason 3+3 involving only 6% of the tissue.  The remainder of the biopsies were benign.  There were 2 cores with chronic and acute inflammation.  An ejaculatory duct cyst was biopsied was noted to be benign. ? ?Prostate MRI on 07/22/2019 showed no findings suspicious for high-grade macroscopic prostate cancer in this patient with known small volume low-grade tumor. PI-RADS 1. Enlargement/nodularity of the central gland, suggesting BPH. Calculated volume 32 mL ?  ?He is s/p ESWL in 2021 for kidney stone.  ? ?His most recent PSA was 6.19 on 11/05/2021.  ? ?He reports that when he starts his stream he has a little hesitancy. His stream is not as strong as it was when he was younger. He reports that he drives about 2-3 hours for work and he noticed when he started this job that his PSA started to elevated. ? ?He reports decreased AM erection and lack of penile fullness. His libido is intact.  ? ? ?PSA trend: ? Prostatic Specific Antigen  ?Latest Ref Rng 0.00 - 4.00 ng/mL  ?08/06/2017 4.30 (H)   ?02/15/2019 3.72   ?07/06/2019 4.00   ?03/27/2020 5.80 (H)   ?10/24/2020 4.84 (H)   ?11/05/2021 6.19 (H)   ?  ?(H) High ? ?PMH: ?Past Medical History:  ?Diagnosis Date  ? Family history of ankylosing spondylitis   ? History of kidney stones   ?  Hypercholesterolemia   ? Hypertension   ? Migraine without aura and without status migrainosus, not intractable   ? Mitral valve regurgitation   ? Renal disorder   ? ? ?Surgical History: ?Past Surgical History:  ?Procedure Laterality Date  ? COLONOSCOPY WITH PROPOFOL N/A 04/10/2021  ? Procedure: COLONOSCOPY WITH PROPOFOL;  Surgeon: Lesly Rubenstein, MD;  Location: [facility] ENDOSCOPY;  Service: Endoscopy;  Laterality: N/A;  ? EXTRACORPOREAL SHOCK WAVE LITHOTRIPSY Right 04/06/2020  ? Procedure: EXTRACORPOREAL SHOCK WAVE LITHOTRIPSY (ESWL);  Surgeon: Abbie Sons, MD;  Location: [facility] ORS;  Service: Urology;  Laterality: Right;  ? HERNIA REPAIR    ? HERNIA REPAIR  1982  ? KNEE ARTHROSCOPY Left 08/02/2016  ? LITHOTRIPSY    ? SHOULDER ARTHROSCOPY Right   ? ? ?Home Medications:  ?Allergies as of 11/09/2021   ? ?   Reactions  ? Other Palpitations, Swelling  ? Nuts/tree fruits/artificial sweeteners ?Nuts/tree fruits/artificial sweeteners ?Tree nuts/tree fruits/artificial sweeteners, band aids and or skin glue (blistering and swollen skin)  ? Tape Rash  ? Bandage glue  ? ?  ? ?  ?Medication List  ?  ? ?  ? Accurate as of Nov 09, 2021 11:59 PM. If you have any questions, ask your nurse or doctor.  ?  ?  ? ?  ? ?STOP taking these medications   ? ?rosuvastatin 20  MG tablet ?Commonly known as: CRESTOR ?Stopped by: [name], MD ?  ? ?  ? ?TAKE these medications   ? ?aspirin EC 81 MG tablet ?Take 81 mg by mouth daily. Swallow whole. ?  ?CO Q10 PO ?Take by mouth daily. ?  ?EXCEDRIN EXTRA STRENGTH PO ?Take by mouth 2 (two) times a week. Saturday and Sunday ?  ?meloxicam 15 MG tablet ?Commonly known as: MOBIC ?Take 15 mg by mouth daily as needed. ?  ?montelukast 10 MG tablet ?Commonly known as: SINGULAIR ?Take 1 tablet by mouth at bedtime. ?  ?olopatadine 0.1 % ophthalmic solution ?Commonly known as: PATANOL ?Place 1 drop into the right eye 2 (two) times daily. ?  ?tamsulosin 0.4 MG Caps capsule ?Commonly known as: FLOMAX ?Take  0.4 mg by mouth daily. ?  ?valsartan 80 MG tablet ?Commonly known as: DIOVAN ?  ? ?  ? ? ?Allergies:  ?Allergies  ?Allergen Reactions  ? Other Palpitations and Swelling  ?  Nuts/tree fruits/artificial sweeteners ?Nuts/tree fruits/artificial sweeteners ?Tree nuts/tree fruits/artificial sweeteners, band aids and or skin glue (blistering and swollen skin)  ? Tape Rash  ?  Bandage glue  ? ? ?Family History: ?Family History  ?Problem Relation Age of Onset  ? Prostate cancer Neg Hx   ? Bladder Cancer Neg Hx   ? Kidney cancer Neg Hx   ? ? ?Social History:  reports that he has never smoked. He has never been exposed to tobacco smoke. He has never used smokeless tobacco. He reports current alcohol use. He reports that he does not use drugs. ? ? ?Physical Exam: ?BP 111/76   Pulse 99   Ht 5' 6.5" (1.689 m)   Wt 172 lb (78 kg)   BMI 27.35 kg/m?   ?Constitutional:  Alert and oriented, No acute distress. ?HEENT: [place] AT, moist mucus membranes.  Trachea midline, no masses. ?Cardiovascular: No clubbing, cyanosis, or edema. ?Respiratory: Normal respiratory effort, no increased work of breathing. ?Rectal: Normal sphincter tone,  Stable left mid prostate nodule and a new right apex nodule  ?Skin: No rashes, bruises or suspicious lesions. ?Neurologic: Grossly intact, no focal deficits, moving all 4 extremities. ?Psychiatric: Normal mood and affect. ? ?Laboratory Data: ? ?Lab Results  ?Component Value Date  ? CREATININE 0.90 07/22/2019  ? ? ? ?Assessment & Plan:   ? ?Prostate cancer ?- PSA continues to steadily rise  ?- Discussed repeat prostate MRI versus repeat biopsy in light of he recent rise in his PSA. He is concerned about the financial aspect for MRI. He declined both of these for the time being but will discuss it further with his wife and let us know if he changes mind. He clearly understands the risk. He will  ?- Symptoms; urinary hesitancy  ?- DRE noted a new nodule at the right apex which is concerning for  progression ? ?2, Erectile dysfunction  ?- Would like to have testosterone checked given lack of AM erections . His libido is intact.  ?-Offered prescription for sildenafil, continues to decline but will let us know if he like to pursue this. ?-Based on our discussion today, there is also a relationship/mental health component of his erectile dysfunction of which he is aware. ? ?We will call with testosterone results, otherwise we will plan for every 6 month PSA, PSA with DRE in a year unless he elects to pursue biopsy and/or prostate MRI ? ? ?Conley Rolls as a scribe for [name], MD.,have documented all relevant documentation on the behalf  of [name], MD,as directed by  [name], MD while in the presence of [name], MD. ? ? ?[facility] ?[address] ?[place], [place] [address] ?[phone] ? ?I spent 3 total minutes on the day of the encounter including pre-visit review of the medical record, face-to-face time with the patient, and post visit ordering of labs/imaging/tests.  Discussion today was lengthy.  He had a lot of questions and concerns. ? ?

## 2021-11-12 ENCOUNTER — Other Ambulatory Visit: Payer: Self-pay | Admitting: *Deleted

## 2021-11-12 NOTE — Telephone Encounter (Signed)
Scheduled patient for prostate biopsy and results appointment. Reviewed instructions in detail, sent instructions thru [facility] ? ?Patient concerned with Testosterone lab, he typically gets up at 4:30 am every morning. He wants to know if getting up this early has an effect on the lab being drawn first thing in the morning?  ?

## 2021-11-15 ENCOUNTER — Other Ambulatory Visit
Admission: RE | Admit: 2021-11-15 | Discharge: 2021-11-15 | Disposition: A | Discharge status: Home / Self Care | Payer: Managed Care, Other (non HMO) | Attending: Urology | Admitting: Urology

## 2021-11-15 DIAGNOSIS — C61 Malignant neoplasm of prostate: Secondary | ICD-10-CM | POA: Insufficient documentation

## 2021-11-16 LAB — TESTOSTERONE: Testosterone: 378 ng/dL (ref 264–916)

## 2021-11-19 ENCOUNTER — Telehealth: Payer: Self-pay | Admitting: *Deleted

## 2021-11-19 NOTE — Telephone Encounter (Addendum)
Patient informed, voiced understanding.    ----- Message from [name], MD sent at 11/19/2021  8:22 AM EDT ----- Testosterone is within the normal range 378.    [name], MD

## 2021-11-27 ENCOUNTER — Other Ambulatory Visit: Payer: Managed Care, Other (non HMO) | Admitting: Urology

## 2021-11-28 ENCOUNTER — Ambulatory Visit (INDEPENDENT_AMBULATORY_CARE_PROVIDER_SITE_OTHER): Payer: Managed Care, Other (non HMO) | Admitting: Urology

## 2021-11-28 VITALS — BP 118/77 | HR 69 | Ht 66.5 in | Wt 172.0 lb

## 2021-11-28 DIAGNOSIS — C61 Malignant neoplasm of prostate: Secondary | ICD-10-CM

## 2021-11-28 MED ORDER — GENTAMICIN SULFATE 40 MG/ML IJ SOLN
80.0000 mg | Freq: Once | INTRAMUSCULAR | Status: AC
  Administered 2021-11-28: 80 mg via INTRAMUSCULAR

## 2021-11-28 MED ORDER — LEVOFLOXACIN 500 MG PO TABS
500.0000 mg | ORAL_TABLET | Freq: Once | ORAL | Status: AC
  Administered 2021-11-28: 500 mg via ORAL

## 2021-11-28 NOTE — Progress Notes (Signed)
   11/28/2021  CC:  Chief Complaint  Patient presents with   Prostate Biopsy      HPI:  Jose Downs is a 56 y.o. male with a personal history of nephrolithiasis and low risk prostate cancer on active surveillance who presents today for a repeat prostate biopsy.     He initially underwent prostate biopsy on 08/27/2017 in the setting of an elevated PSA to 4.30 and an abnormal rectal exam.  He has a personal history of fluctuating PSA. TRUS vol 36 cc.   Prostate biopsy revealed a single core Gleason 3+3 involving only 6% of the tissue.  The remainder of the biopsies were benign.  There were 2 cores with chronic and acute inflammation.  An ejaculatory duct cyst was biopsied was noted to be benign.   Prostate MRI on 07/22/2019 showed no findings suspicious for high-grade macroscopic prostate cancer in this patient with known small volume low-grade tumor. PI-RADS 1. Enlargement/nodularity of the central gland, suggesting BPH. Calculated volume 32 mL  His most recent PSA rose to 6.19 on 11/05/2021.   He is also a change in his rectal exam.  He has a new right apical nodule in addition to his left mid.  Vitals:   11/28/21 1142  BP: 118/77  Pulse: 69   NED. A&Ox3.   No respiratory distress   Abd soft, NT, ND Normal external genitalia with patent urethral meatus  Prostate Biopsy Procedure   Informed consent was obtained after discussing risks/benefits of the procedure.  A time out was performed to ensure correct patient identity.  Pre-Procedure: - Last PSA Level:  Component     Latest Ref Rng 11/05/2021  Prostatic Specific Antigen     0.00 - 4.00 ng/mL 6.19 (H)     (H) High - Gentamicin given prophylactically - Levaquin 500 mg administered PO -Transrectal Ultrasound performed revealing a 42.9 gm prostate -No significant hypoechoic or median lobe noted - Correlation with rectal exam and TRUS findings  Procedure: - Prostate block performed using 10 cc 1% lidocaine and biopsies  taken from sextant areas, a total of 12 under ultrasound guidance.  Post-Procedure: - Patient tolerated the procedure well - He was counseled to seek immediate medical attention if experiences any severe pain, significant bleeding, or fevers - Return in one week to discuss biopsy results  I have reviewed the above documentation for accuracy and completeness, and I agree with the above.   [name], MD

## 2021-11-30 LAB — SURGICAL PATHOLOGY

## 2021-12-07 ENCOUNTER — Ambulatory Visit (INDEPENDENT_AMBULATORY_CARE_PROVIDER_SITE_OTHER): Payer: Managed Care, Other (non HMO) | Admitting: Urology

## 2021-12-07 VITALS — BP 152/94 | HR 62 | Ht 66.5 in | Wt 172.0 lb

## 2021-12-07 DIAGNOSIS — C61 Malignant neoplasm of prostate: Secondary | ICD-10-CM

## 2021-12-07 DIAGNOSIS — R361 Hematospermia: Secondary | ICD-10-CM

## 2021-12-07 NOTE — Progress Notes (Signed)
12/07/21 3:54 PM   Jose Downs [birth year] [id number]  Referring provider:  Kirk Ruths, MD [place] [facility] [place],  [place] [address] Chief Complaint  Patient presents with   Results      HPI: Jose Downs is a 56 y.o.male with a personal history of nephrolithiasis and low risk prostate cancer on active surveillance who presents today for a repeat prostate biopsy results.   He initially underwent prostate biopsy on 08/27/2017 in the setting of an elevated PSA to 4.30 and an abnormal rectal exam.  He has a personal history of fluctuating PSA. TRUS vol 36 cc.   Prostate biopsy revealed a single core Gleason 3+3 involving only 6% of the tissue.  The remainder of the biopsies were benign.  There were 2 cores with chronic and acute inflammation.  An ejaculatory duct cyst was biopsied was noted to be benign.  His most recent PSA rose to 6.19 on 11/05/2021. He underwent a prostate biopsy on 11/28/2021. He had a nodule at the right apical to his left mid pre- biopsy. TRUS was 42.9.   Surgical pathology revealed Gleason 3+3 involving 6 cores affecting up to 26%.   He reports that wound injection site he has needle like pain. He denies it radiating to his back leg. He also reports hematospermia.   Surgical pathology:  Diagnostic Summary   [A] PROSTATE, LEFT BASE:   ACINAR ADENOCARCINOMA, GLEASON 3+3=6  (GG  1), INVOLVING 1/1 CORES, MEASURING 1  MM ( 7%).   [B] PROSTATE, LEFT MID:   NEGATIVE FOR MALIGNANCY.   [C] PROSTATE, LEFT APEX:   SMALL FOCUS OF ATYPICAL GLANDS.   [D] PROSTATE, RIGHT BASE:   ACINAR ADENOCARCINOMA, GLEASON 3+3=6  (GG  1), INVOLVING 1/1 CORES, MEASURING 5  MM ( 36%).   [E] PROSTATE, RIGHT MID:   ACINAR ADENOCARCINOMA, GLEASON 3+3=6  (GG  1), INVOLVING 1/1 CORES, MEASURING 4  MM ( 29%).   [F] PROSTATE, RIGHT APEX:   ACINAR ADENOCARCINOMA, GLEASON 3+3=6  (GG  1), INVOLVING 1/1 CORES, MEASURING 7  MM ( 41%).   [G] PROSTATE, LEFT  LATERAL BASE:   ACINAR ADENOCARCINOMA, GLEASON 3+3=6  (GG 1), INVOLVING 1/1 CORES, MEASURING 3  MM ( 25%).   [H] PROSTATE, LEFT LATERAL MID:   NEGATIVE FOR MALIGNANCY.   [I] PROSTATE, LEFT LATERAL APEX:   NEGATIVE FOR MALIGNANCY.   [J] PROSTATE, RIGHT LATERAL BASE:   NEGATIVE FOR MALIGNANCY.   [K] PROSTATE, RIGHT LATERAL MID:   ACINAR ADENOCARCINOMA, GLEASON 3+3=6  (GG 1), INVOLVING 1/1 CORES, MEASURING 3  MM ( 27%).   [L] PROSTATE, RIGHT LATERAL APEX:   NEGATIVE FOR MALIGNANCY.  FIBROMUSCULAR STROMA ONLY.       PMH: Past Medical History:  Diagnosis Date   Family history of ankylosing spondylitis    History of kidney stones    Hypercholesterolemia    Hypertension    Migraine without aura and without status migrainosus, not intractable    Mitral valve regurgitation    Renal disorder     Surgical History: Past Surgical History:  Procedure Laterality Date   COLONOSCOPY WITH PROPOFOL N/A 04/10/2021   Procedure: COLONOSCOPY WITH PROPOFOL;  Surgeon: Lesly Rubenstein, MD;  Location: [facility] ENDOSCOPY;  Service: Endoscopy;  Laterality: N/A;   EXTRACORPOREAL SHOCK WAVE LITHOTRIPSY Right 04/06/2020   Procedure: EXTRACORPOREAL SHOCK WAVE LITHOTRIPSY (ESWL);  Surgeon: Abbie Sons, MD;  Location: [facility] ORS;  Service: Urology;  Laterality: Right;   HERNIA REPAIR  HERNIA REPAIR  1982   KNEE ARTHROSCOPY Left 08/02/2016   LITHOTRIPSY     SHOULDER ARTHROSCOPY Right     Home Medications:  Allergies as of 12/07/2021       Reactions   Other Palpitations, Swelling   Nuts/tree fruits/artificial sweeteners Nuts/tree fruits/artificial sweeteners Tree nuts/tree fruits/artificial sweeteners, band aids and or skin glue (blistering and swollen skin)   Tape Rash   Bandage glue        Medication List        Accurate as of December 07, 2021  3:54 PM. If you have any questions, ask your nurse or doctor.          meloxicam 15 MG tablet Commonly known as: MOBIC Take 15 mg by mouth daily  as needed.   montelukast 10 MG tablet Commonly known as: SINGULAIR Take 1 tablet by mouth at bedtime.   olopatadine 0.1 % ophthalmic solution Commonly known as: PATANOL Place 1 drop into the right eye 2 (two) times daily.   tamsulosin 0.4 MG Caps capsule Commonly known as: FLOMAX Take 0.4 mg by mouth daily.   valsartan 80 MG tablet Commonly known as: DIOVAN        Allergies:  Allergies  Allergen Reactions   Other Palpitations and Swelling    Nuts/tree fruits/artificial sweeteners Nuts/tree fruits/artificial sweeteners Tree nuts/tree fruits/artificial sweeteners, band aids and or skin glue (blistering and swollen skin)   Tape Rash    Bandage glue    Family History: Family History  Problem Relation Age of Onset   Prostate cancer Neg Hx    Bladder Cancer Neg Hx    Kidney cancer Neg Hx     Social History:  reports that he has never smoked. He has never been exposed to tobacco smoke. He has never used smokeless tobacco. He reports current alcohol use. He reports that he does not use drugs.   Physical Exam: BP (!) 152/94   Pulse 62   Ht 5' 6.5" (1.689 m)   Wt 172 lb (78 kg)   BMI 27.35 kg/m   Constitutional:  Alert and oriented, No acute distress. HEENT: [place] AT, moist mucus membranes.  Trachea midline, no masses. Cardiovascular: No clubbing, cyanosis, or edema. Respiratory: Normal respiratory effort, no increased work of breathing. Skin: No rashes, bruises or suspicious lesions.Examined gluteus he had normal appearing site noted soreness at injection site. Neurologic: Grossly intact, no focal deficits, moving all 4 extremities. Psychiatric: Normal mood and affect.  Laboratory Data:  Lab Results  Component Value Date   CREATININE 0.90 07/22/2019   No results found for: "HGBA1C"  Pertinent Imaging:  No results found for any visits on 12/07/21.  Assessment & Plan:   Prostate cancer - Increased volume but still fails into intermediate risk. Reasonable to  maintain active surveillance with PSA every 6 months. And rectal exam in 1 year   - He was offered genetic testing called Oncotype DX. He declined this offering.   2. Hematospermia  - Normal and expected after prostate biopsy should resolve spontaneously   3. Soreness in buttock (injection site)  - Offered him imaging to monitor he declined - Suspect it is local irritation  - Discussed heat compresses, stretching, and NSAIDs  No follow-ups on file.  I,Kailey Littlejohn,acting as a Education administrator for [name], MD.,have documented all relevant documentation on the behalf of [name], MD,as directed by  [name], MD while in the presence of [name], [place] [address]  [place], [place] [address] [phone]

## 2021-12-15 ENCOUNTER — Encounter: Payer: Self-pay | Admitting: Urology

## 2021-12-15 DIAGNOSIS — M7918 Myalgia, other site: Secondary | ICD-10-CM

## 2021-12-19 ENCOUNTER — Ambulatory Visit
Admission: RE | Admit: 2021-12-19 | Discharge: 2021-12-19 | Disposition: A | Discharge status: Home / Self Care | Payer: Managed Care, Other (non HMO) | Attending: Urology | Admitting: Urology

## 2021-12-19 ENCOUNTER — Ambulatory Visit
Admission: RE | Admit: 2021-12-19 | Discharge: 2021-12-19 | Disposition: A | Discharge status: Home / Self Care | Payer: Managed Care, Other (non HMO) | Source: Ambulatory Visit | Attending: Urology | Admitting: Urology

## 2021-12-19 DIAGNOSIS — M7918 Myalgia, other site: Secondary | ICD-10-CM | POA: Insufficient documentation

## 2021-12-20 ENCOUNTER — Telehealth: Payer: Self-pay

## 2022-05-08 ENCOUNTER — Other Ambulatory Visit
Admission: RE | Admit: 2022-05-08 | Discharge: 2022-05-08 | Disposition: A | Discharge status: Home / Self Care | Payer: Managed Care, Other (non HMO) | Attending: Urology | Admitting: Urology

## 2022-05-08 DIAGNOSIS — C61 Malignant neoplasm of prostate: Secondary | ICD-10-CM | POA: Diagnosis present

## 2022-05-08 LAB — PSA: Prostatic Specific Antigen: 5.84 ng/mL — ABNORMAL HIGH (ref 0.00–4.00)

## 2022-11-01 ENCOUNTER — Other Ambulatory Visit
Admission: RE | Admit: 2022-11-01 | Discharge: 2022-11-01 | Disposition: A | Discharge status: Home / Self Care | Payer: Managed Care, Other (non HMO) | Source: Ambulatory Visit | Attending: Urology | Admitting: Urology

## 2022-11-01 DIAGNOSIS — C61 Malignant neoplasm of prostate: Secondary | ICD-10-CM | POA: Diagnosis present

## 2022-11-01 LAB — PSA: Prostatic Specific Antigen: 7.03 ng/mL — ABNORMAL HIGH (ref 0.00–4.00)

## 2022-11-08 ENCOUNTER — Ambulatory Visit (INDEPENDENT_AMBULATORY_CARE_PROVIDER_SITE_OTHER): Payer: Managed Care, Other (non HMO) | Admitting: Urology

## 2022-11-08 VITALS — BP 123/84 | Ht 66.5 in | Wt 172.0 lb

## 2022-11-08 DIAGNOSIS — C61 Malignant neoplasm of prostate: Secondary | ICD-10-CM | POA: Diagnosis not present

## 2022-11-08 DIAGNOSIS — N529 Male erectile dysfunction, unspecified: Secondary | ICD-10-CM | POA: Diagnosis not present

## 2022-11-08 DIAGNOSIS — N3941 Urge incontinence: Secondary | ICD-10-CM | POA: Diagnosis not present

## 2022-11-08 MED ORDER — SILDENAFIL CITRATE 20 MG PO TABS: ORAL_TABLET | ORAL | 11 refills | Status: DC

## 2022-11-08 NOTE — Patient Instructions (Signed)
Lab visit in 6 months in November of 2024 Lab visit 3-4 days before your one year follow up with Dr.Brandon

## 2022-11-08 NOTE — Progress Notes (Signed)
I, Amy L Pierron, acting as a scribe for Vanna Scotland, MD.,have documented all relevant documentation on the behalf of Vanna Scotland, MD,as directed by  Vanna Scotland, MD while in the presence of Vanna Scotland, MD.  11/08/2022 9:40 PM   Jose Downs [birth year] [id number]  Referring provider: Lauro Regulus, MD [address] [facility] [place],  [place] [address]  Chief Complaint  Patient presents with   Follow-up    HPI: 57 year-old male with a personal history of prostate cancer (low risk on active surveillance), nephrolithiasis, and acute chronic prostatic inflammation, returns today for a routine annual follow-up.  He initially underwent prostate biopsy on 08/27/2017 in the setting of an elevated PSA to 4.30 and an abnormal rectal exam.  He has a personal history of fluctuating PSA. TRUS vol 36 cc.   Prostate biopsy revealed a single core Gleason 3+3 involving only 6% of the tissue.  The remainder of the biopsies were benign.  There were 2 cores with chronic and acute inflammation.  An ejaculatory duct cyst was biopsied was noted to be benign.   His most recent PSA rose to 6.19 on 11/05/2021. He underwent a prostate biopsy on 11/28/2021. He had a nodule at the right apical to his left mid pre- biopsy. TRUS was 42.9.    Surgical pathology revealed Gleason 3+3 involving 6 cores affecting up to 26%.   His most recent PSA on 11/01/2022 was 7.03.   PSA Trend: 02/15/2019    3.72 07/06/2019      4.00 03/27/2020    5.80 10/24/2020    4.84 11/05/2021      6.19 05/08/2022    5.84 11/01/2022      7.03  He mentioned his job duties have changed and now does a lot of sitting and driving and is wondering if that could make the PSA rise. He has increased urgency and his stream has been changing. He said it starts strong, gets weak, then strong again. He reports no longer having morning erections. He has also difficulty getting an erection. He purchased a penile pump and used it  each day. There has been some improvement but not as they used to be. He still has a normal libido.     PMH: Past Medical History:  Diagnosis Date   Family history of ankylosing spondylitis    History of kidney stones    Hypercholesterolemia    Hypertension    Migraine without aura and without status migrainosus, not intractable    Mitral valve regurgitation    Renal disorder     Surgical History: Past Surgical History:  Procedure Laterality Date   COLONOSCOPY WITH PROPOFOL N/A 04/10/2021   Procedure: COLONOSCOPY WITH PROPOFOL;  Surgeon: Regis Bill, MD;  Location: [facility] ENDOSCOPY;  Service: Endoscopy;  Laterality: N/A;   EXTRACORPOREAL SHOCK WAVE LITHOTRIPSY Right 04/06/2020   Procedure: EXTRACORPOREAL SHOCK WAVE LITHOTRIPSY (ESWL);  Surgeon: Riki Altes, MD;  Location: [facility] ORS;  Service: Urology;  Laterality: Right;   HERNIA REPAIR     HERNIA REPAIR  1982   KNEE ARTHROSCOPY Left 08/02/2016   LITHOTRIPSY     SHOULDER ARTHROSCOPY Right     Home Medications:  Allergies as of 11/08/2022       Reactions   Other Palpitations, Swelling   Nuts/tree fruits/artificial sweeteners Nuts/tree fruits/artificial sweeteners Tree nuts/tree fruits/artificial sweeteners, band aids and or skin glue (blistering and swollen skin)   Tape Rash   Bandage glue  Medication List        Accurate as of Nov 08, 2022  9:40 PM. If you have any questions, ask your nurse or doctor.          meloxicam 15 MG tablet Commonly known as: MOBIC Take 15 mg by mouth daily as needed.   montelukast 10 MG tablet Commonly known as: SINGULAIR Take 1 tablet by mouth at bedtime.   olopatadine 0.1 % ophthalmic solution Commonly known as: PATANOL Place 1 drop into the right eye 2 (two) times daily.   sildenafil 20 MG tablet Commonly known as: REVATIO Take 1-5 tablets one hour prior to intercourse as needed   tamsulosin 0.4 MG Caps capsule Commonly known as: FLOMAX Take 0.4 mg by  mouth daily.   valsartan 80 MG tablet Commonly known as: DIOVAN        Allergies:  Allergies  Allergen Reactions   Other Palpitations and Swelling    Nuts/tree fruits/artificial sweeteners Nuts/tree fruits/artificial sweeteners Tree nuts/tree fruits/artificial sweeteners, band aids and or skin glue (blistering and swollen skin)   Tape Rash    Bandage glue    Family History: Family History  Problem Relation Age of Onset   Prostate cancer Neg Hx    Bladder Cancer Neg Hx    Kidney cancer Neg Hx     Social History:  reports that he has never smoked. He has never been exposed to tobacco smoke. He has never used smokeless tobacco. He reports current alcohol use. He reports that he does not use drugs.   Physical Exam: BP 123/84   Ht 5' 6.5" (1.689 m)   Wt 172 lb (78 kg)   BMI 27.35 kg/m   Constitutional:  Alert and oriented, No acute distress. HEENT: [place] AT, moist mucus membranes.  Trachea midline, no masses. GU: Prostate nodule at the right apex which was palpable.  Neurologic: Grossly intact, no focal deficits, moving all 4 extremities. Psychiatric: Normal mood and affect.   Assessment & Plan:    Prostate cancer  -  In light of his fairly significant rise in PSA, would recommend consideration of repeat MRI.   - First option is to do a repeat PSA in 3 months or get the MRI now. He prefers to have it checked in 3 months due to high insurance deductible and cost of an MRI.  2. Erectile Dysfunction  - Had some improvement with a penile pump he purchased but still not having complete erections. In terms of PDE 5 inhibitors, we discussed contraindications for this medication as well as common side effects. Patient was counseled on optimal use. All of his questions were answered in detail. Sent Viagra 20 mg prescription to the pharmacy.  3. Urinary Urgency  - Happens on occasion and is not very bothered by it. Will continue to monitor.    Return in about 3 months (around  02/08/2023) for PSA.   [facility] [address], [address] [place], [place] [address] [phone]

## 2022-11-12 ENCOUNTER — Telehealth: Payer: Self-pay

## 2022-11-12 DIAGNOSIS — C61 Malignant neoplasm of prostate: Secondary | ICD-10-CM

## 2022-11-12 NOTE — Telephone Encounter (Signed)
Ordered, please let him know  Vanna Scotland, MD

## 2022-11-12 NOTE — Telephone Encounter (Signed)
Patient called in today and left a message that he would like to have an MRI after thinking about his options over the weekend.

## 2022-11-12 NOTE — Telephone Encounter (Signed)
Patient advised earlier this will be ordered and we will get PA done with insurance and then [facility] scheduler will call to set up appointment, MRI prep instructions sent via mychart message

## 2022-11-14 ENCOUNTER — Telehealth: Payer: Self-pay

## 2022-11-14 NOTE — Addendum Note (Signed)
Addended by: Consuella Lose on: 11/14/2022 10:56 AM   Modules accepted: Orders

## 2022-11-14 NOTE — Addendum Note (Signed)
Addended by: Consuella Lose on: 11/14/2022 11:09 AM   Modules accepted: Orders

## 2022-11-15 ENCOUNTER — Ambulatory Visit: Payer: Managed Care, Other (non HMO) | Admitting: Urology

## 2022-11-15 NOTE — Telephone Encounter (Signed)
Pt called asking about MRI locations, message routed to [facility]

## 2022-12-02 ENCOUNTER — Ambulatory Visit
Admission: RE | Admit: 2022-12-02 | Discharge: 2022-12-02 | Disposition: A | Discharge status: Home / Self Care | Payer: Managed Care, Other (non HMO) | Source: Ambulatory Visit | Attending: Urology | Admitting: Urology

## 2022-12-02 DIAGNOSIS — C61 Malignant neoplasm of prostate: Secondary | ICD-10-CM | POA: Diagnosis present

## 2022-12-02 MED ORDER — GADOBUTROL 1 MMOL/ML IV SOLN
7.0000 mL | Freq: Once | INTRAVENOUS | Status: AC | PRN
  Administered 2022-12-02: 7 mL via INTRAVENOUS

## 2022-12-03 ENCOUNTER — Encounter: Payer: Self-pay | Admitting: Urology

## 2022-12-03 DIAGNOSIS — C61 Malignant neoplasm of prostate: Secondary | ICD-10-CM

## 2022-12-03 DIAGNOSIS — R972 Elevated prostate specific antigen [PSA]: Secondary | ICD-10-CM

## 2022-12-03 NOTE — Telephone Encounter (Signed)
FYI response from the patient

## 2022-12-19 ENCOUNTER — Encounter: Payer: Self-pay | Admitting: Urology

## 2022-12-19 NOTE — Telephone Encounter (Signed)
Patient advised. Instructed patient to go to ER or UC if needed to be seen before next week as at this time we do not have any openings. Patient states his pain is not debilitating and he will seek treatment prior to his appointment with if needed. I offered Monday appointment but patient asked for Tuesday in case he does better over the weekend and does not need an appointment and does not want to waste that spot.

## 2022-12-19 NOTE — Telephone Encounter (Signed)
Does patient need to be worked in or ok to increase medication?

## 2022-12-24 ENCOUNTER — Ambulatory Visit: Payer: Managed Care, Other (non HMO) | Admitting: Physician Assistant

## 2023-02-11 ENCOUNTER — Other Ambulatory Visit
Admission: RE | Admit: 2023-02-11 | Discharge: 2023-02-11 | Disposition: A | Discharge status: Home / Self Care | Payer: Managed Care, Other (non HMO) | Attending: Urology | Admitting: Urology

## 2023-02-11 DIAGNOSIS — C61 Malignant neoplasm of prostate: Secondary | ICD-10-CM | POA: Diagnosis not present

## 2023-02-11 LAB — PSA: Prostatic Specific Antigen: 6.13 ng/mL — ABNORMAL HIGH (ref 0.00–4.00)

## 2023-05-22 ENCOUNTER — Other Ambulatory Visit
Admission: RE | Admit: 2023-05-22 | Discharge: 2023-05-22 | Disposition: A | Discharge status: Home / Self Care | Payer: Managed Care, Other (non HMO) | Attending: Urology | Admitting: Urology

## 2023-05-22 DIAGNOSIS — C61 Malignant neoplasm of prostate: Secondary | ICD-10-CM | POA: Diagnosis present

## 2023-05-23 LAB — PSA: Prostatic Specific Antigen: 7.25 ng/mL — ABNORMAL HIGH (ref 0.00–4.00)

## 2023-11-03 ENCOUNTER — Other Ambulatory Visit: Payer: Self-pay

## 2023-11-03 ENCOUNTER — Other Ambulatory Visit
Admission: RE | Admit: 2023-11-03 | Discharge: 2023-11-03 | Disposition: A | Discharge status: Home / Self Care | Attending: Urology | Admitting: Urology

## 2023-11-03 DIAGNOSIS — C61 Malignant neoplasm of prostate: Secondary | ICD-10-CM | POA: Diagnosis present

## 2023-11-03 LAB — PSA: Prostatic Specific Antigen: 7.57 ng/mL — ABNORMAL HIGH (ref 0.00–4.00)

## 2023-11-07 ENCOUNTER — Ambulatory Visit (INDEPENDENT_AMBULATORY_CARE_PROVIDER_SITE_OTHER): Payer: Self-pay | Admitting: Urology

## 2023-11-07 VITALS — BP 150/93 | HR 74 | Ht 66.5 in | Wt 170.0 lb

## 2023-11-07 DIAGNOSIS — R3912 Poor urinary stream: Secondary | ICD-10-CM | POA: Diagnosis not present

## 2023-11-07 DIAGNOSIS — N401 Enlarged prostate with lower urinary tract symptoms: Secondary | ICD-10-CM

## 2023-11-07 DIAGNOSIS — C61 Malignant neoplasm of prostate: Secondary | ICD-10-CM | POA: Diagnosis not present

## 2023-11-07 DIAGNOSIS — N529 Male erectile dysfunction, unspecified: Secondary | ICD-10-CM | POA: Diagnosis not present

## 2023-11-07 MED ORDER — TAMSULOSIN HCL 0.4 MG PO CAPS: 0.4000 mg | ORAL_CAPSULE | Freq: Every day | ORAL | 11 refills | Status: AC

## 2023-11-07 NOTE — Progress Notes (Signed)
 Elfrieda Grise Plume,acting as a scribe for Dustin Gimenez, MD.,have documented all relevant documentation on the behalf of Dustin Gimenez, MD,as directed by  Dustin Gimenez, MD while in the presence of Dustin Gimenez, MD.  11/07/23 4:31 PM   Jose Downs 02-02-1966 [id number]  Referring provider: Jimmy Moulding, MD [address] [facility] [place] [facility] [place],  [place] [address]  Chief Complaint  Patient presents with   Prostate Cancer   Erectile Dysfunction   Nephrolithiasis    HPI:  58 year-old male with a personal history of prostate cancer, chronic prostatic inflammation, and nephrolithiasis. He presents today for a follow-up visit.   He initially underwent prostate biopsy on 08/27/2017 in the setting of an elevated PSA to 4.30 and an abnormal rectal exam.  He has a personal history of fluctuating PSA. TRUS vol 36 cc.   Prostate biopsy revealed a single core Gleason 3+3 involving only 6% of the tissue.  The remainder of the biopsies were benign.  There were 2 cores with chronic and acute inflammation.  An ejaculatory duct cyst was biopsied was noted to be benign.   His most recent PSA rose to 6.19 on 11/05/2021. He underwent a prostate biopsy on 11/28/2021. He had a nodule at the right apical to his left mid pre- biopsy. TRUS was 42.9.    Surgical pathology revealed Gleason 3+3 involving 6 cores affecting up to 26%.   His most recent PSA level is stable at 7.57, consistent with last year's results.   He reports a weaker urinary stream than normal, with occasional stopping and starting, but denies significant difficulty with urination. He is currently taking Flomax  intermittently and has not recently taken it to address the flow issue.  He also inquires about the use of Sildenafil , which he has been taking at a low dose.   Prostatic Specific Antigen  Latest Ref Rng 0.00 - 4.00 ng/mL  02/15/2019 3.72   07/06/2019 4.00   03/27/2020 5.80 (H)   10/24/2020 4.84 (H)    11/05/2021 6.19 (H)   05/08/2022 5.84 (H)   11/01/2022 7.03 (H)   02/11/2023 6.13 (H)   05/22/2023 7.25 (H)   11/03/2023 7.57 (H)      PMH: Past Medical History:  Diagnosis Date   Family history of ankylosing spondylitis    History of kidney stones    Hypercholesterolemia    Hypertension    Migraine without aura and without status migrainosus, not intractable    Mitral valve regurgitation    Renal disorder     Surgical History: Past Surgical History:  Procedure Laterality Date   COLONOSCOPY WITH PROPOFOL  N/A 04/10/2021   Procedure: COLONOSCOPY WITH PROPOFOL ;  Surgeon: Shane Darling, MD;  Location: [facility] END[facility];  Service: Endoscopy;  Laterality: N/A;   EXTRACORPOREAL SHOCK WAVE LITHOTRIPSY Right 04/06/2020   Procedure: EXTRACORPOREAL SHOCK WAVE LITHOTRIPSY (ESWL);  Surgeon: Geraline Knapp, MD;  Location: [facility] ORS;  Service: Urology;  Laterality: Right;   HERNIA REPAIR     HERNIA REPAIR  1982   KNEE ARTHROSCOPY Left 08/02/2016   LITHOTRIPSY     SHOULDER ARTHROSCOPY Right     Home Medications:  Allergies as of 11/07/2023       Reactions   Other Palpitations, Swelling   Nuts/tree fruits/artificial sweeteners Nuts/tree fruits/artificial sweeteners Tree nuts/tree fruits/artificial sweeteners, band aids and or skin glue (blistering and swollen skin)   Tape Rash   Bandage glue        Medication List  Accurate as of Nov 07, 2023  4:31 PM. If you have any questions, ask your nurse or doctor.          meloxicam 15 MG tablet Commonly known as: MOBIC Take 15 mg by mouth daily as needed.   montelukast 10 MG tablet Commonly known as: SINGULAIR Take 1 tablet by mouth at bedtime.   olopatadine 0.1 % ophthalmic solution Commonly known as: PATANOL Place 1 drop into the right eye 2 (two) times daily.   sildenafil  20 MG tablet Commonly known as: REVATIO  Take 1-5 tablets one hour prior to intercourse as needed   tamsulosin  0.4 MG Caps capsule Commonly known  as: FLOMAX  Take 1 capsule (0.4 mg total) by mouth daily. What changed:  when to take this reasons to take this   valsartan 80 MG tablet Commonly known as: DIOVAN Take 80 mg by mouth daily. What changed: Another medication with the same name was removed. Continue taking this medication, and follow the directions you see here.   Vitamin D-1000 Max St 25 MCG (1000 UT) tablet Generic drug: Cholecalciferol Take 1,000 Units by mouth daily.        Allergies:  Allergies  Allergen Reactions   Other Palpitations and Swelling    Nuts/tree fruits/artificial sweeteners Nuts/tree fruits/artificial sweeteners Tree nuts/tree fruits/artificial sweeteners, band aids and or skin glue (blistering and swollen skin)   Tape Rash    Bandage glue    Family History: Family History  Problem Relation Age of Onset   Prostate cancer Neg Hx    Bladder Cancer Neg Hx    Kidney cancer Neg Hx     Social History:  reports that he has never smoked. He has never been exposed to tobacco smoke. He has never used smokeless tobacco. He reports current alcohol use. He reports that he does not use drugs.   Physical Exam: BP (!) 150/93 (BP Location: Left Arm, Patient Position: Sitting, Cuff Size: Large)   Pulse 74   Ht 5' 6.5" (1.689 m)   Wt 170 lb (77.1 kg)   BMI 27.03 kg/m   Constitutional:  Alert and oriented, No acute distress. HEENT: [place] AT, moist mucus membranes.  Trachea midline, no masses. GU: Small nodule near the right apex of the prostate, which is also stable. Neurologic: Grossly intact, no focal deficits, moving all 4 extremities. Psychiatric: Normal mood and affect.   Assessment & Plan:    1. History of Prostate Cancer - His PSA is stable at 7.57, consistent with last year's results.  - Continue with surveillance and monitor PSA levels every six months.  - No current indication for a biopsy as PSA has been stable for a year.  2. BPH with weak stream - He reports a weak urinary stream  and intermittent flow.  - Recommend starting Tamsulosin  (Flomax ) daily to improve urinary flow.  - Monitor symptoms and consider procedural interventions if symptoms worsen.  3. Erectile Dysfunction - He is currently on Sildenafil .  - Advised that it can be taken with or without food, but higher efficacy may be achieved on an empty stomach.  - He can increase the dose up to 100 mg (five 20 mg tablets) if needed, but should not exceed one dose per day.  4. Chronic Prostatic Inflammation - Continue monitoring as part of prostate cancer surveillance. No new treatment plan discussed.  Return in about 6 months (around 05/09/2024) for lab visit with PSA. Annual follow up in 1 year with PSA, IPSS, and PVR.  I  have reviewed the above documentation for accuracy and completeness, and I agree with the above.   Dustin Gimenez, MD   [facility] [address] [place], [place] [address] [phone]

## 2024-01-16 ENCOUNTER — Encounter: Payer: Self-pay | Admitting: Urology

## 2024-01-16 DIAGNOSIS — N401 Enlarged prostate with lower urinary tract symptoms: Secondary | ICD-10-CM

## 2024-01-16 DIAGNOSIS — N529 Male erectile dysfunction, unspecified: Secondary | ICD-10-CM

## 2024-01-20 MED ORDER — TADALAFIL 5 MG PO TABS: 5.0000 mg | ORAL_TABLET | Freq: Every day | ORAL | 11 refills | Status: AC

## 2024-01-20 MED ORDER — SILDENAFIL CITRATE 20 MG PO TABS: ORAL_TABLET | ORAL | 11 refills | Status: AC

## 2024-01-20 NOTE — Telephone Encounter (Signed)
 Pt informed. Meds sent. Pt was advise to call his PCP about blood pressure medications. Pt voiced understanding.

## 2024-03-24 ENCOUNTER — Other Ambulatory Visit: Payer: Self-pay | Admitting: Internal Medicine

## 2024-03-24 ENCOUNTER — Ambulatory Visit
Admission: RE | Admit: 2024-03-24 | Discharge: 2024-03-24 | Disposition: A | Discharge status: Home / Self Care | Source: Ambulatory Visit | Attending: Internal Medicine | Admitting: Internal Medicine

## 2024-03-24 DIAGNOSIS — R1084 Generalized abdominal pain: Secondary | ICD-10-CM | POA: Insufficient documentation

## 2024-03-24 MED ORDER — IOHEXOL 300 MG/ML  SOLN
100.0000 mL | Freq: Once | INTRAMUSCULAR | Status: AC | PRN
  Administered 2024-03-24: 100 mL via INTRAVENOUS

## 2024-03-31 ENCOUNTER — Ambulatory Visit (INDEPENDENT_AMBULATORY_CARE_PROVIDER_SITE_OTHER): Admitting: Physician Assistant

## 2024-03-31 VITALS — BP 168/109 | HR 63 | Wt 175.0 lb

## 2024-03-31 DIAGNOSIS — C61 Malignant neoplasm of prostate: Secondary | ICD-10-CM

## 2024-03-31 DIAGNOSIS — N529 Male erectile dysfunction, unspecified: Secondary | ICD-10-CM | POA: Diagnosis not present

## 2024-03-31 DIAGNOSIS — R3912 Poor urinary stream: Secondary | ICD-10-CM

## 2024-03-31 DIAGNOSIS — N200 Calculus of kidney: Secondary | ICD-10-CM

## 2024-03-31 DIAGNOSIS — N401 Enlarged prostate with lower urinary tract symptoms: Secondary | ICD-10-CM | POA: Diagnosis not present

## 2024-03-31 DIAGNOSIS — R310 Gross hematuria: Secondary | ICD-10-CM | POA: Diagnosis not present

## 2024-03-31 LAB — BLADDER SCAN AMB NON-IMAGING

## 2024-03-31 NOTE — Patient Instructions (Signed)

## 2024-04-04 ENCOUNTER — Encounter: Payer: Self-pay | Admitting: Physician Assistant

## 2024-04-04 NOTE — Progress Notes (Signed)
 03/31/2024 3:05 PM   Jose Downs 1965/07/28 [id number]  CC: Chief Complaint  Patient presents with   Follow-up   Benign Prostatic Hypertrophy   HPI: Jose Downs is a 58 y.o. male with PMH low risk prostate cancer on active surveillance, BPH with weak stream on Flomax  as needed and low-dose daily tadalafil , ED on sildenafil , and chronic inflammatory prostatitis who presents today for routine follow-up.   He had a CTAP with and without contrast on 03/24/2024 in the setting of abdominal pain, which showed a 7 mm nonobstructing left lower pole renal stone and no other significant urologic findings.  Today he reports occasional urinary intermittency.  He continues to take Flomax  as needed.  He stopped demand dose sildenafil  due to acid reflux and feels that his ED is well-managed on low-dose daily tadalafil  alone.  Sadly, his father passed away earlier today.  He had recently been diagnosed with metastatic bladder cancer, though his official cause of death has not yet been determined.  Patient does report some recent intermittent gross hematuria.  He states he had a hematuria workup about 15 years ago, which was negative.  He is a never smoker.  IPSS 8/mostly satisfied as below. PVR 24mL.   IPSS     Row Name 03/31/24 1500         International Prostate Symptom Score   How often have you had the sensation of not emptying your bladder? Not at All     How often have you had to urinate less than every two hours? Less than 1 in 5 times     How often have you found you stopped and started again several times when you urinated? About half the time     How often have you found it difficult to postpone urination? Less than half the time     How often have you had a weak urinary stream? Less than 1 in 5 times     How often have you had to strain to start urination? Not at All     How many times did you typically get up at night to urinate? 1 Time     Total IPSS Score 8       Quality of Life  due to urinary symptoms   If you were to spend the rest of your life with your urinary condition just the way it is now how would you feel about that? Mostly Satisfied        PMH: Past Medical History:  Diagnosis Date   Family history of ankylosing spondylitis    History of kidney stones    Hypercholesterolemia    Hypertension    Migraine without aura and without status migrainosus, not intractable    Mitral valve regurgitation    Renal disorder     Surgical History: Past Surgical History:  Procedure Laterality Date   COLONOSCOPY WITH PROPOFOL  N/A 04/10/2021   Procedure: COLONOSCOPY WITH PROPOFOL ;  Surgeon: Maryruth Ole DASEN, MD;  Location: [facility];  Service: Endoscopy;  Laterality: N/A;   EXTRACORPOREAL SHOCK WAVE LITHOTRIPSY Right 04/06/2020   Procedure: EXTRACORPOREAL SHOCK WAVE LITHOTRIPSY (ESWL);  Surgeon: Twylla Jose BROCKS, MD;  Location: [facility] ORS;  Service: Urology;  Laterality: Right;   HERNIA REPAIR     HERNIA REPAIR  1982   KNEE ARTHROSCOPY Left 08/02/2016   LITHOTRIPSY     SHOULDER ARTHROSCOPY Right     Home Medications:  Allergies as of 03/31/2024       Reactions  Other Palpitations, Swelling   Nuts/tree fruits/artificial sweeteners Nuts/tree fruits/artificial sweeteners Tree nuts/tree fruits/artificial sweeteners, band aids and or skin glue (blistering and swollen skin)   Tape Rash   Bandage glue        Medication List        Accurate as of March 31, 2024 11:59 PM. If you have any questions, ask your nurse or doctor.          meloxicam 15 MG tablet Commonly known as: MOBIC Take 15 mg by mouth daily as needed.   montelukast 10 MG tablet Commonly known as: SINGULAIR Take 1 tablet by mouth at bedtime.   olopatadine 0.1 % ophthalmic solution Commonly known as: PATANOL Place 1 drop into the right eye 2 (two) times daily.   sildenafil  20 MG tablet Commonly known as: REVATIO  Take 1-5 tablets one hour prior to intercourse as needed    tadalafil  5 MG tablet Commonly known as: CIALIS  Take 1 tablet (5 mg total) by mouth daily.   tamsulosin  0.4 MG Caps capsule Commonly known as: FLOMAX  Take 1 capsule (0.4 mg total) by mouth daily.   valsartan 80 MG tablet Commonly known as: DIOVAN Take 80 mg by mouth daily.   Vitamin D-1000 Max St 25 MCG (1000 UT) tablet Generic drug: Cholecalciferol Take 1,000 Units by mouth daily.        Allergies:  Allergies  Allergen Reactions   Other Palpitations and Swelling    Nuts/tree fruits/artificial sweeteners Nuts/tree fruits/artificial sweeteners Tree nuts/tree fruits/artificial sweeteners, band aids and or skin glue (blistering and swollen skin)   Tape Rash    Bandage glue    Family History: Family History  Problem Relation Age of Onset   Prostate cancer Neg Hx    Bladder Cancer Neg Hx    Kidney cancer Neg Hx     Social History:   reports that he has never smoked. He has never been exposed to tobacco smoke. He has never used smokeless tobacco. He reports current alcohol use. He reports that he does not use drugs.  Physical Exam: BP (!) 168/109 (BP Location: Left Arm, Patient Position: Sitting, Cuff Size: Normal)   Pulse 63   Wt 175 lb (79.4 kg)   SpO2 96%   BMI 27.82 kg/m   Constitutional:  Alert and oriented, no acute distress, nontoxic appearing HEENT: [place], AT Cardiovascular: No clubbing, cyanosis, or edema Respiratory: Normal respiratory effort, no increased work of breathing Skin: No rashes, bruises or suspicious lesions Neurologic: Grossly intact, no focal deficits, moving all 4 extremities Psychiatric: Normal mood and affect  Laboratory Data: Results for orders placed or performed in visit on 03/31/24  Bladder Scan (Post Void Residual) in office   Collection Time: 03/31/24  3:22 PM  Result Value Ref Range   Scan Result 24mL    Assessment & Plan:   1. Benign prostatic hyperplasia with weak urinary stream (Primary) Symptoms well-controlled on  low-dose daily tadalafil  and Flomax  as needed.  He is emptying appropriately.  Will continue to monitor. - Bladder Scan (Post Void Residual) in office  2. Erectile dysfunction, unspecified erectile dysfunction type Symptoms well-controlled on low-dose daily tadalafil .  Acid reflux on sildenafil .  If he needs additional support in the future, may consider increasing to 10 mg daily tadalafil  or demand dose tadalafil  to see if he tolerates it better.  3. Prostate cancer (HCC) On active surveillance, due for PSA next month.  Will keep current plan. - PSA; Future  4. Gross hematuria Remote negative hematuria  workup with a recent history of intermittent gross hematuria.  We discussed that this could be secondary to #5 below, however given his family history I did recommend cystoscopy for further evaluation and he agreed.  5. Left renal stone Incidental nonobstructing left renal stone on recent CT.  I recommended surveillance and he agreed.  May consider elective management in the future per patient preference.   Return in about 4 weeks (around 04/28/2024) for Cysto with Dr. Georganne with PSA prior.  Lucie Hones, PA-C  [facility] [place] [address], [address] [place], [place] [address] [phone]

## 2024-04-29 ENCOUNTER — Other Ambulatory Visit

## 2024-04-29 DIAGNOSIS — C61 Malignant neoplasm of prostate: Secondary | ICD-10-CM

## 2024-04-29 NOTE — Progress Notes (Deleted)
   05/06/2024 7:43 AM   Jose Downs [birth year] [id number]  Cystoscopy Procedure Note:  Indication:  Intermittent GH   - s/p negative workup >15 years ago  - never smoker  - Fhx of metastatic Bladder CA (father)   - Hx of low-risk CaP on AS (dx 2019)   After informed consent and discussion of the procedure and its risks, Jose Downs was positioned and prepped in the standard fashion. Cystoscopy was performed with a flexible cystoscope. The urethra, bladder neck and bladder mucosa were visualized in a systematic fashion. The ureteral orifices were noted in orthotopic location and orientation. There were no bladder mucosal lesions, stones, debris or anatomic variants noted. The prostate gland was {[email or web address]:33375}.  Imaging: Recent CT A/P w/ delays (03/24/24) - 7mm LLP renal stone. Large multifocal benign Right renal cysts measuring up to 7.5 cm at right upper posterior pole  Findings: ***  Assessment and Plan:   Prostate Ca: - due for PSA for AS, as well as interval biopsy or MRI. Last MRI June 2024, last biopsy in May 2023  Jose Skye, MD 04/29/2024

## 2024-04-30 LAB — PSA: Prostate Specific Ag, Serum: 6.6 ng/mL — ABNORMAL HIGH (ref 0.0–4.0)

## 2024-05-06 ENCOUNTER — Other Ambulatory Visit: Admitting: Urology

## 2024-05-17 NOTE — Progress Notes (Deleted)
   05/20/2024 7:56 AM   Jose Downs 28-Oct-1965 [id number]  Cystoscopy Procedure Note:  Indication:  Intermittent GH   - s/p negative workup >15 years ago  - never smoker  - Fhx of metastatic Bladder CA (father)   - Hx of low-risk CaP on AS (dx 2019)   After informed consent and discussion of the procedure and its risks, Jose Downs was positioned and prepped in the standard fashion. Cystoscopy was performed with a flexible cystoscope. The urethra, bladder neck and bladder mucosa were visualized in a systematic fashion. The ureteral orifices were noted in orthotopic location and orientation. There were no bladder mucosal lesions, stones, debris or anatomic variants noted. The prostate gland was {[email or web address]}.  Imaging: Recent CT A/P w/ delays (03/24/24) - 7mm LLP renal stone. Large multifocal benign Right renal cysts measuring up to 7.5 cm at right upper posterior pole  Findings: ***  Assessment and Plan:   Prostate Ca: - Recent PSA 6.6 (from 3.9-4.4 baseline). Due for interval biopsy or MRI. Last MRI June 2024, last biopsy in May 2023  [name], MD 05/17/2024

## 2024-05-20 ENCOUNTER — Other Ambulatory Visit: Admitting: Urology

## 2024-05-26 NOTE — Progress Notes (Deleted)
   06/03/2024 3:16 PM   Jose Downs 15-Mar-1966 [id number]  Cystoscopy Procedure Note:  Indication:  Intermittent GH   - s/p negative workup >15 years ago  - never smoker  - Fhx of metastatic Bladder CA (father)   - Hx of low-risk CaP on AS (dx 2019)   After informed consent and discussion of the procedure and its risks, Jose Downs was positioned and prepped in the standard fashion. Cystoscopy was performed with a flexible cystoscope. The urethra, bladder neck and bladder mucosa were visualized in a systematic fashion. The ureteral orifices were noted in orthotopic location and orientation. There were no bladder mucosal lesions, stones, debris or anatomic variants noted. The prostate gland was {[email or web address]:33375}.  Imaging: Recent CT A/P w/ delays (03/24/24) - 7mm LLP renal stone. Large multifocal benign Right renal cysts measuring up to 7.5 cm at right upper posterior pole  Findings: ***  Assessment and Plan:   Prostate Ca: - Recent PSA 6.6 (from 3.9-4.4 baseline). Due for interval biopsy or MRI. Last MRI June 2024, last biopsy in May 2023  [name], MD 05/26/2024

## 2024-06-03 ENCOUNTER — Other Ambulatory Visit: Admitting: Urology

## 2024-06-08 NOTE — Progress Notes (Signed)
 Goals      Follow my doctor's care plan       PHQ 2/9 last 3 flowsheet values     05/30/2022 06/03/2023 06/08/2024  PHQ-9 Depression Screening   Little interest or pleasure in doing things  0 0  Feeling down, depressed, or hopeless  0 0  (OBSOLETE) Little interest or pleasure in doing things 0    (OBSOLETE) Feeling down, depressed, or hopeless (or irritable for Teens only)? 0    (OBSOLETE) Total Score = 0        Depression Severity and Treatment Recommendations:  0-4= None  5-9= Mild / Treatment: Support, educate to call if worse; return in one month  10-14= Moderate / Treatment: Support, watchful waiting; Antidepressant or Psychotherapy  15-19= Moderately severe / Treatment: Antidepressant OR Psychotherapy  >= 20 = Major depression, severe / Antidepressant AND Psychotherapy  Please note approximately 15 minutes was spent and depression screening by me and nursing staff.     Jose Downs is a 58 y.o. male here for his annual exam with health maintenance   Chief Complaint  Annual exam   HISTORY OF PRESENT ILLNESS  Prostate cancer (CMS-HCC) Low risk on active surveillance followed by urology   Hypercholesterolemia Working on diet and exercise and hasn't wanted a statin   HTN, goal below 140/90 Taking medications without noted side effects or dizziness.    Fatty liver Working on diet and exercise    Review of Systems; Constitutional; No weight loss, fever, chills, weakness  HEENT: No visual loss, blurred vision, hearing loss, ear pain, runny nose or sore throat SKIN; No rash or itching CARDIOVASCULAR; No chest pain, pressure. No palpitations or edema RESPIRATORY; No shortness of breath, cough or sputum GASTROINTESTINAL; No nausea, vomiting, diarrhea or dysphagia GENITOURINARY; No dysuria, new incontinence, suprapubic pain NEUROLOGICAL; No headache, dizziness, syncopy, tingling MUSCULOSKELETAL; No muscle or joint pain or injuries HEMATOLOGICAL; No anemia,  bleeding or abnormal bruising noted PSYCHIATRIC; No manic symptoms or severely blue mood ENDOCRINE; No sweating, temperature intolerance or polyuria, polydipsia  Patient Active Problem List  Diagnosis   HTN, goal below 140/90   Hypercholesterolemia   Migraine without aura and without status migrainosus, not intractable   Kidney stones   Family history of ankylosing spondylitis   Trigeminal neuralgia   Fatty liver   Thoracic aortic atherosclerosis   Health care maintenance   Statin declined   Prostate cancer (CMS/HHS-HCC)    Past Medical History:  Diagnosis Date   Allergy Tree nuts, raw tree fruit, artificial sugar   Arrhythmia    Arthritis    Cancer (CMS/HHS-HCC)    GERD (gastroesophageal reflux disease) Some evenings   Heart murmur    HTN, goal below 140/90 05/05/2017   Hypercholesterolemia 05/05/2017   Kidney stones 05/05/2017   Migraine without aura and without status migrainosus, not intractable 05/05/2017   Prostate cancer (CMS/HHS-HCC) February 2019   Its being monitored regularly    Past Surgical History:  Procedure Laterality Date   COLONOSCOPY  04/10/2021   Normal examined colon/Repeat 88yrs/CTL   HERNIA REPAIR     KNEE ARTHROSCOPY     LITHOTRIPSY      Family History  Problem Relation Name Age of Onset   Cancer Mother Dagoberto    Breast cancer Mother Dagoberto    Migraines Daughter     Prostate cancer Paternal Apolinar Dunks    Prostate cancer Paternal Higinio Kava     Allergies  Allergen Reactions   Other Swelling and Palpitations  Tree nuts/tree fruits/artificial sweeteners, band aids and or skin glue (blistering and swollen skin)      Social History   Socioeconomic History   Marital status: Married  Tobacco Use   Smoking status: Never   Smokeless tobacco: Never  Vaping Use   Vaping status: Never Used  Substance and Sexual Activity   Alcohol use: Yes    Alcohol/week: 1.0 standard drink of alcohol    Types:  1 Standard drinks or equivalent per week    Comment: Every other week to once a month   Drug use: No   Sexual activity: Yes    Partners: Female    Birth control/protection: None  Social History Narrative   Education: Trade school   Occupation: Personnel Officer   Hobbies: biking, reading   Marital Status: married   Social Drivers of Corporate Investment Banker Strain: Low Risk  (06/08/2024)   Overall Financial Resource Strain (CARDIA)    Difficulty of Paying Living Expenses: Not hard at all  Food Insecurity: No Food Insecurity (06/08/2024)   Hunger Vital Sign    Worried About Running Out of Food in the Last Year: Never true    Ran Out of Food in the Last Year: Never true  Transportation Needs: No Transportation Needs (06/08/2024)   PRAPARE - Administrator, Civil Service (Medical): No    Lack of Transportation (Non-Medical): No  Housing Stability: Low Risk  (06/08/2024)   Housing Stability Vital Sign    Unable to Pay for Housing in the Last Year: No    Number of Times Moved in the Last Year: 0    Homeless in the Last Year: No      Current Outpatient Medications:    acetylcysteine (NAC ORAL), Take by mouth, Disp: , Rfl:    cholecalciferol 1000 unit tablet, Take by mouth, Disp: , Rfl:    MAGNESIUM CITRATE ORAL, Take by mouth, Disp: , Rfl:    om-3/dha/epa/dpa/fish/curcumin (OMEGA-3 2100 WITH TURMERIC ORAL), Take by mouth, Disp: , Rfl:    propranoloL (INDERAL) 20 MG tablet, Take 1 tablet (20 mg total) by mouth 2 (two) times daily, Disp: 180 tablet, Rfl: 3   QUERCETIN ORAL, Take by mouth, Disp: , Rfl:    tadalafiL  (CIALIS ) 5 MG tablet, Take 5 mg by mouth once daily, Disp: , Rfl:    tamsulosin  (FLOMAX ) 0.4 mg capsule, Take 1 capsule (0.4 mg total) by mouth once daily as needed for up to 90 days, Disp: 90 capsule, Rfl: 0   vitamin B complex (B COMPLEX ORAL), Take by mouth, Disp: , Rfl:   Vitals:   06/08/24 1519  BP: 136/78  Pulse: 55  Resp: 16   Body mass  index is 28.89 kg/m. No acute distress, pleasant  HEENT: Normocephalic and Atraumatic, Oropharynx is clear, Tympanic membranes clear, conjunctiva have normal color NECK: No bruits, thyromegalia or adenopathy is noted CHEST; No distress, normal to inspection, clear to auscultation CARDIOVASCULAR; Regular rate and rhythm, no murmurs rubs or gallops appreciated. Peripheral pulses were palpated and present.  ABDOMEN; Soft and flat and nontender with bowel sounds appreciated in the normal range EXTREMITIES; No clubbing cyanosis or edema NEUROLOGICAL; Alert and responsive with good insight. Motor function and sensation are intact. Reflexes are present SKIN; No suspicious lesions are noted.    Appointment on 06/01/2024  Component Date Value Ref Range Status   Glucose 06/01/2024 93  70 - 110 mg/dL Final   Sodium 87/97/7974 140  136 - 145 mmol/L Final  Potassium 06/01/2024 4.8  3.6 - 5.1 mmol/L Final   Chloride 06/01/2024 104  97 - 109 mmol/L Final   Carbon Dioxide (CO2) 06/01/2024 32.7 (H)  22.0 - 32.0 mmol/L Final   Calcium 06/01/2024 9.2  8.7 - 10.3 mg/dL Final   Urea Nitrogen (BUN) 06/01/2024 15  7 - 25 mg/dL Final   Creatinine 87/97/7974 0.9  0.7 - 1.3 mg/dL Final   Glomerular Filtration Rate (eGFR) 06/01/2024 98  >60 mL/min/1.73sq m Final   BUN/Crea Ratio 06/01/2024 16.7  6.0 - 20.0 Final   Anion Gap w/K 06/01/2024 8.1  6.0 - 16.0 Final   Hemoglobin A1C 06/01/2024 5.5  4.2 - 5.6 % Final   Average Blood Glucose (Calc) 06/01/2024 111  mg/dL Final   Cholesterol, Total 06/01/2024 230 (H)  100 - 200 mg/dL Final   Triglyceride 87/97/7974 68  35 - 199 mg/dL Final   HDL (High Density Lipoprotein) Cho* 06/01/2024 60.8  29.0 - 71.0 mg/dL Final   LDL Calculated 06/01/2024 843 (H)  0 - 130 mg/dL Final   VLDL Cholesterol 06/01/2024 14  mg/dL Final   Cholesterol/HDL Ratio 06/01/2024 3.8   Final   Protein, Total 06/01/2024 6.3  6.1 - 7.9 g/dL Final   Albumin 87/97/7974 4.1  3.5  - 4.8 g/dL Final   Bilirubin, Total 06/01/2024 1.2  0.3 - 1.2 mg/dL Final   Bilirubin, Conjugated 06/01/2024 0.21 (H)  0.00 - 0.20 mg/dL Final   Alk Phos (alkaline Phosphatase) 06/01/2024 79  34 - 104 U/L Final   AST  06/01/2024 25  8 - 39 U/L Final   ALT  06/01/2024 36  6 - 57 U/L Final  Same Day Visit on 03/23/2024  Component Date Value Ref Range Status   WBC (White Blood Cell Count) 03/23/2024 7.3  4.1 - 10.2 103/uL Final   RBC (Red Blood Cell Count) 03/23/2024 5.41  4.69 - 6.13 106/uL Final   Hemoglobin 03/23/2024 17.1  14.1 - 18.1 gm/dL Final   Hematocrit 90/76/7974 50.9  40.0 - 52.0 % Final   MCV (Mean Corpuscular Volume) 03/23/2024 94.1  80.0 - 100.0 fl Final   MCH (Mean Corpuscular Hemoglobin) 03/23/2024 31.6 (H)  27.0 - 31.2 pg Final   MCHC (Mean Corpuscular Hemoglobin * 03/23/2024 33.6  32.0 - 36.0 gm/dL Final   Platelet Count 03/23/2024 290  150 - 450 103/uL Final   RDW-CV (Red Cell Distribution Widt* 03/23/2024 12.1  11.6 - 14.8 % Final   MPV (Mean Platelet Volume) 03/23/2024 9.0 (L)  9.4 - 12.4 fl Final   Neutrophils 03/23/2024 4.02  1.50 - 7.80 103/uL Final   Lymphocytes 03/23/2024 1.62  1.00 - 3.60 103/uL Final   Monocytes 03/23/2024 1.25  0.00 - 1.50 103/uL Final   Eosinophils 03/23/2024 0.26  0.00 - 0.55 103/uL Final   Basophils 03/23/2024 0.07  0.00 - 0.09 103/uL Final   Neutrophil % 03/23/2024 55.1  32.0 - 70.0 % Final   Lymphocyte % 03/23/2024 22.3  10.0 - 50.0 % Final   Monocyte % 03/23/2024 17.2 (H)  4.0 - 13.0 % Final   Eosinophil % 03/23/2024 3.6  1.0 - 5.0 % Final   Basophil% 03/23/2024 1.0  0.0 - 2.0 % Final   Immature Granulocyte % 03/23/2024 0.8 (H)  <=0.7 % Final   Immature Granulocyte Count 03/23/2024 0.06  <=0.06 10^3/L Final   Glucose 03/23/2024 77  70 - 110 mg/dL Final   Sodium 90/76/7974 139  136 - 145 mmol/L Final   Potassium 03/23/2024  4.5  3.6 - 5.1 mmol/L Final   Chloride 03/23/2024 104  97 - 109 mmol/L  Final   Carbon Dioxide (CO2) 03/23/2024 30.1  22.0 - 32.0 mmol/L Final   Urea Nitrogen (BUN) 03/23/2024 19  7 - 25 mg/dL Final   Creatinine 90/76/7974 1.0  0.7 - 1.3 mg/dL Final   Glomerular Filtration Rate (eGFR) 03/23/2024 87  >60 mL/min/1.73sq m Final   Calcium 03/23/2024 9.6  8.7 - 10.3 mg/dL Final   AST  90/76/7974 18  8 - 39 U/L Final   ALT  03/23/2024 21  6 - 57 U/L Final   Alk Phos (alkaline Phosphatase) 03/23/2024 85  34 - 104 U/L Final   Albumin 03/23/2024 4.3  3.5 - 4.8 g/dL Final   Bilirubin, Total 03/23/2024 1.0  0.3 - 1.2 mg/dL Final   Protein, Total 03/23/2024 6.7  6.1 - 7.9 g/dL Final   A/G Ratio 90/76/7974 1.8  1.0 - 5.0 gm/dL Final   Lipase 90/76/7974 34  11 - 82 U/L Final   Color 03/23/2024 Light Yellow  Colorless, Straw, Light Yellow, Yellow, Dark Yellow Final   Clarity 03/23/2024 Clear  Clear Final   Specific Gravity 03/23/2024 1.018  1.005 - 1.030 Final   pH, Urine 03/23/2024 5.5  5.0 - 8.0 Final   Protein, Urinalysis 03/23/2024 Negative  Negative mg/dL Final   Glucose, Urinalysis 03/23/2024 Negative  Negative mg/dL Final   Ketones, Urinalysis 03/23/2024 Negative  Negative mg/dL Final   Blood, Urinalysis 03/23/2024 Negative  Negative Final   Nitrite, Urinalysis 03/23/2024 Negative  Negative Final   Leukocyte Esterase, Urinalysis 03/23/2024 Negative  Negative Final   Bilirubin, Urinalysis 03/23/2024 Negative  Negative Final   Urobilinogen, Urinalysis 03/23/2024 0.2  0.2 - 1.0 mg/dL Final   WBC, UA 90/76/7974 <1  <=5 /hpf Final   Red Blood Cells, Urinalysis 03/23/2024 <1  <=3 /hpf Final   Bacteria, Urinalysis 03/23/2024 0-5  0 - 5 /hpf Final   Squamous Epithelial Cells, Urinaly* 03/23/2024 0  /hpf Final   Urine Culture, Routine - [facility] 03/23/2024 Final report   Final   Result 1 - [facility] 03/23/2024 No growth   Final    ASSESSMENT  AND PLAN:  Diagnoses and all orders for this visit:  Routine general medical examination at a  health care facility -     Basic Metabolic Panel (BMP); Future -     Hemoglobin A1C; Future -     Hepatic Function Panel (HFP); Future -     Lipid Panel w/calc LDL; Future  Depression screening -     Depression Screen -(PHQ- 2/9, BDI)  HTN, goal below 140/90 Assessment & Plan: Taking medications without noted side effects or dizziness.    Orders: -     Basic Metabolic Panel (BMP); Future -     Hemoglobin A1C; Future -     Hepatic Function Panel (HFP); Future -     Lipid Panel w/calc LDL; Future  Fatty liver Assessment & Plan: Working on diet and exercise   Orders: -     Basic Metabolic Panel (BMP); Future -     Hemoglobin A1C; Future -     Hepatic Function Panel (HFP); Future -     Lipid Panel w/calc LDL; Future  Hypercholesterolemia Assessment & Plan: Working on diet and exercise and hasn't wanted a statin   Orders: -     Basic Metabolic Panel (BMP); Future -     Hemoglobin A1C; Future -     Hepatic Function  Panel (HFP); Future -     Lipid Panel w/calc LDL; Future  Prostate cancer (CMS/HHS-HCC) Assessment & Plan: Low risk on active surveillance followed by urology    Statin declined      Goals      Follow my doctor's care plan

## 2024-06-09 NOTE — Progress Notes (Signed)
° °  06/10/2024 11:38 AM   Jose Downs [birth year] [id number]  Cystoscopy Procedure Note:  Indication:  Intermittent GH   - s/p negative workup >15 years ago  - never smoker  - Fhx of metastatic Bladder CA (father)   - Hx of low-risk CaP on AS (dx 2019)   After informed consent and discussion of the procedure and its risks, Jose Downs was positioned and prepped in the standard fashion. Cystoscopy was performed with a flexible cystoscope. The urethra, bladder neck and bladder mucosa were visualized in a systematic fashion. The ureteral orifices were noted in orthotopic location and orientation. There were no bladder mucosal lesions, stones, debris or anatomic variants noted. The prostate gland was bilobar with mild occlusive hypertrophy.  Barbotage urine cytology collected  Imaging: Recent CT A/P w/ delays (03/24/24) - 7mm LLP renal stone. Large multifocal benign Right renal cysts measuring up to 7.5 cm at right upper posterior pole  Findings: Normal male cystourethroscopy  Assessment and Plan: - F/u urine cytology   Prostate Ca: - Recent PSA 6.6 (from 3.9-4.4 baseline). Due for interval biopsy or MRI. Last MRI June 2024, last biopsy in May 2023  - Will order interval MRI. Follow up with me once complete   [name], MD 06/09/2024

## 2024-06-10 ENCOUNTER — Ambulatory Visit: Admitting: Urology

## 2024-06-10 ENCOUNTER — Encounter: Payer: Self-pay | Admitting: Urology

## 2024-06-10 VITALS — BP 166/95 | HR 60 | Ht 66.5 in | Wt 179.0 lb

## 2024-06-10 DIAGNOSIS — C61 Malignant neoplasm of prostate: Secondary | ICD-10-CM | POA: Diagnosis not present

## 2024-06-10 NOTE — Patient Instructions (Signed)
 Please call [phone] to schedule your imaging prior to your appointment. Please allow time for your imaging results as this can take up 2-3 weeks to review. A follow up appointment as already been scheduled for the doctor to review results with you.

## 2024-06-11 LAB — MICROSCOPIC EXAMINATION

## 2024-06-11 LAB — URINALYSIS, COMPLETE
Bilirubin, UA: NEGATIVE
Glucose, UA: NEGATIVE
Ketones, UA: NEGATIVE
Leukocytes,UA: NEGATIVE
Nitrite, UA: NEGATIVE
Protein,UA: NEGATIVE
RBC, UA: NEGATIVE
Specific Gravity, UA: 1.03 (ref 1.005–1.030)
Urobilinogen, Ur: 0.2 mg/dL (ref 0.2–1.0)
pH, UA: 6 (ref 5.0–7.5)

## 2024-06-17 LAB — "CYTOLOGY PLUS MONITORING PROFILE: PAP & FEULGEN "

## 2024-07-12 ENCOUNTER — Ambulatory Visit: Admitting: Urology

## 2024-08-05 ENCOUNTER — Ambulatory Visit
Admission: RE | Admit: 2024-08-05 | Discharge: 2024-08-05 | Disposition: A | Source: Ambulatory Visit | Attending: Urology

## 2024-08-05 DIAGNOSIS — C61 Malignant neoplasm of prostate: Secondary | ICD-10-CM

## 2024-08-05 DIAGNOSIS — R310 Gross hematuria: Secondary | ICD-10-CM | POA: Insufficient documentation

## 2024-08-05 MED ORDER — GADOBUTROL 1 MMOL/ML IV SOLN
8.0000 mL | Freq: Once | INTRAVENOUS | Status: AC | PRN
  Administered 2024-08-05: 8 mL via INTRAVENOUS

## 2024-08-05 NOTE — Assessment & Plan Note (Signed)
 Intermittent GH   - s/p negative workup >15 years ago  - never smoker  - Fhx of metastatic Bladder CA (father)  - Cysto (Jan 2026) - normal, cytology - atypical urothelial cells  - repeat cysto + cytology in 6 mo? - CT urogram?

## 2024-08-05 NOTE — Progress Notes (Unsigned)
" ° °  08/18/2024 4:30 PM   Jose Downs [birth year] [id number]  Reason for visit: Follow up prostate Ca   HPI: 59 y.o. male, follow up with me today  MRI prostate (08/05/24) - ***  Recent cystoscopy negative, cytology atypical cells of unknown signifiance  Prior HPI: Hx of low-risk CaP (dx 2019)   - Biopsy 2019 - GS6 in 1 core (~6% involvement) , PSA 6  - Biopsy 2023 - GS6 in 6 cores (~26% involvement), PSA 7.5  - Recent PSA 6.6 (from 3.9-7.5 baseline)   Intermittent GH   - s/p negative workup >15 years ago  - never smoker  - Fhx of metastatic Bladder CA (father)  - Cysto (Jan 2026) - normal, cytology - atypical urothelial cells    Physical Exam: There were no vitals taken for this visit.   Constitutional:  Alert and oriented, No acute distress.  Laboratory Data: Component Ref Range & Units (hover) 3 mo ago (04/29/24) 5 yr ago (08/17/18) 6 yr ago (02/03/18) 7 yr ago (12/10/16) 7 yr ago (09/06/16)  Prostate Specific Ag, Serum 6.6 High  3.9 CM 4.2 High  CM 3.2 CM 4.4 High  CM    Pertinent Imaging: I have personally viewed and interpreted the MRI prostate (08/05/24) - ***.    Assessment & Plan:    Prostate cancer [facility]) Assessment & Plan: Hx of low-risk CaP (dx 2019)   - Biopsy 2019 - GS6 in 1 core (~6% involvement) , PSA 6  - Biopsy 2023 - GS6 in 6 cores (~26% involvement), PSA 7.5  - Recent PSA 6.6 (from 3.9-7.5 baseline)   MRI Prostate 08/05/24 -         Penne JONELLE Skye, MD  [facility] [address] [place], [place] [address] [phone] "

## 2024-08-05 NOTE — Assessment & Plan Note (Signed)
 Hx of low-risk CaP (dx 2019)   - Biopsy 2019 - GS6 in 1 core (~6% involvement) , PSA 6  - Biopsy 2023 - GS6 in 6 cores (~26% involvement), PSA 7.5  - Recent PSA 6.6 (from 3.9-7.5 baseline)   MRI Prostate 08/05/24 -

## 2024-08-18 ENCOUNTER — Ambulatory Visit: Admitting: Urology

## 2024-08-18 DIAGNOSIS — C61 Malignant neoplasm of prostate: Secondary | ICD-10-CM

## 2024-08-18 DIAGNOSIS — R310 Gross hematuria: Secondary | ICD-10-CM

## 2024-11-01 ENCOUNTER — Ambulatory Visit: Admitting: Urology
